# Patient Record
Sex: Female | Born: 1935 | Race: Black or African American | Hispanic: No | Marital: Single | Smoking: Never smoker
Health system: Southern US, Community
[De-identification: ages and names within clinical notes are randomized; demographics above are authoritative.]

## PROBLEM LIST (undated history)

## (undated) DIAGNOSIS — I10 Essential (primary) hypertension: Secondary | ICD-10-CM

## (undated) DIAGNOSIS — E785 Hyperlipidemia, unspecified: Secondary | ICD-10-CM

## (undated) DIAGNOSIS — E119 Type 2 diabetes mellitus without complications: Secondary | ICD-10-CM

---

## 2002-11-19 ENCOUNTER — Encounter: Payer: Self-pay | Admitting: Emergency Medicine

## 2002-11-19 ENCOUNTER — Emergency Department (HOSPITAL_COMMUNITY): Admission: EM | Admit: 2002-11-19 | Discharge: 2002-11-19 | Payer: Self-pay | Admitting: Emergency Medicine

## 2010-07-08 ENCOUNTER — Emergency Department: Payer: Self-pay | Admitting: Internal Medicine

## 2012-12-31 ENCOUNTER — Ambulatory Visit: Payer: Self-pay | Admitting: Internal Medicine

## 2013-01-12 ENCOUNTER — Ambulatory Visit: Payer: Self-pay | Admitting: Ophthalmology

## 2013-01-28 ENCOUNTER — Ambulatory Visit: Payer: Self-pay | Admitting: Internal Medicine

## 2013-01-28 HISTORY — PX: BREAST BIOPSY: SHX20

## 2013-03-10 ENCOUNTER — Ambulatory Visit: Payer: Self-pay | Admitting: Ophthalmology

## 2013-03-10 LAB — POTASSIUM: Potassium: 3.6 mmol/L (ref 3.5–5.1)

## 2013-03-23 ENCOUNTER — Ambulatory Visit: Payer: Self-pay | Admitting: Ophthalmology

## 2013-06-23 ENCOUNTER — Ambulatory Visit: Payer: Self-pay | Admitting: Internal Medicine

## 2013-06-25 ENCOUNTER — Ambulatory Visit: Payer: Self-pay | Admitting: Internal Medicine

## 2013-07-05 ENCOUNTER — Ambulatory Visit: Payer: Self-pay | Admitting: Internal Medicine

## 2013-07-06 LAB — PATHOLOGY REPORT

## 2014-01-10 ENCOUNTER — Ambulatory Visit: Payer: Self-pay | Admitting: Podiatry

## 2014-01-13 ENCOUNTER — Encounter: Payer: Self-pay | Admitting: Podiatry

## 2014-01-13 ENCOUNTER — Ambulatory Visit (INDEPENDENT_AMBULATORY_CARE_PROVIDER_SITE_OTHER): Payer: Medicare HMO | Admitting: Podiatry

## 2014-01-13 ENCOUNTER — Ambulatory Visit (INDEPENDENT_AMBULATORY_CARE_PROVIDER_SITE_OTHER): Payer: Medicare HMO

## 2014-01-13 VITALS — BP 159/82 | HR 100 | Resp 16 | Ht 66.0 in | Wt 130.0 lb

## 2014-01-13 DIAGNOSIS — M779 Enthesopathy, unspecified: Secondary | ICD-10-CM

## 2014-01-13 DIAGNOSIS — M79673 Pain in unspecified foot: Secondary | ICD-10-CM

## 2014-01-13 DIAGNOSIS — B351 Tinea unguium: Secondary | ICD-10-CM

## 2014-01-13 NOTE — Progress Notes (Signed)
   Subjective:    Patient ID: Alexis Oconnell, female    DOB: 1935-05-06, 78 y.o.   MRN: [id number]  HPI Comments: 85 her old female presents the office they with her son for diabetic risk assessment and for painful elongated toenails. The patient states that her nails are painful particularly with shoe gear. Over the last several months the patient does that she is started to have some pain in the bottom of her foot within the arch of her foot. She states that she hasn't pain with ambulation or after periods of activity. She's been soaking her feet Epson salts and her daughter has been trimming her toenails. She denies any recent injury or trauma to the area. Denies any recent redness, swelling. She denies any history of ulceration or any intermittent claudication symptoms. No other complaints at this time.      Review of Systems  All other systems reviewed and are negative.      Objective:   Physical Exam AAO x3, NAD DP/PT pulses palpable bilaterally, CRT less than 3 seconds Protective sensation intact with Simms Weinstein monofilament, vibratory sensation intact, Achilles tendon reflex intact Nails hypertrophic, dystrophic, elongated, brittle, discolored 10. No surrounding erythema or drainage from around the nail sites. No open lesions or pre-ulcerative lesions. There is decrease in medial arch height upon weightbearing. There is mild discomfort on the course of the peroneal tendon on the left foot. There is no evidence of pinpoint bony tenderness or pain with vibratory sensation to bilateral lower extremity. There is no overlying edema, erythema, increased warmth of bilateral lower extremities. MMT 4/5, ROM WNL No pain with calf compression, swelling, warmth, erythema. Upon evaluation of the patient's shoes they're significantly worn out.     Assessment & Plan:  78 year old female with symptomatically onychomycosis, left foot peroneal tendinitis -X-rays were obtained and reviewed with  the patient. -Treatment options were discussed with the patient including alternatives, risks, complications. -Nail sharply debrided 10 without complications. -Discussed importance daily foot inspection. -Discussed likely etiology of left foot peroneal tendinitis. -Recommended the patient to purchase new shoes. Also prescribed diabetic shoes/insert spur patient to go to [facility]. -Follow-up in 3 months or sooner if any problems are to arise. In the meantime, call the office with any questions, concerns, changes symptoms. If the area on the left foot worsens, or has not resolved after purchasing new shoes to call the office for further evaluation.

## 2014-01-13 NOTE — Patient Instructions (Signed)
Diabetes and Foot Care Diabetes may cause you to have problems because of poor blood supply (circulation) to your feet and legs. This may cause the skin on your feet to become thinner, break easier, and heal more slowly. Your skin may become dry, and the skin may peel and crack. You may also have nerve damage in your legs and feet causing decreased feeling in them. You may not notice minor injuries to your feet that could lead to infections or more serious problems. Taking care of your feet is one of the most important things you can do for yourself.  HOME CARE INSTRUCTIONS  Wear shoes at all times, even in the house. Do not go barefoot. Bare feet are easily injured.  Check your feet daily for blisters, cuts, and redness. If you cannot see the bottom of your feet, use a mirror or ask someone for help.  Wash your feet with warm water (do not use hot water) and mild soap. Then pat your feet and the areas between your toes until they are completely dry. Do not soak your feet as this can dry your skin.  Apply a moisturizing lotion or petroleum jelly (that does not contain alcohol and is unscented) to the skin on your feet and to dry, brittle toenails. Do not apply lotion between your toes.  Trim your toenails straight across. Do not dig under them or around the cuticle. File the edges of your nails with an emery board or nail file.  Do not cut corns or calluses or try to remove them with medicine.  Wear clean socks or stockings every day. Make sure they are not too tight. Do not wear knee-high stockings since they may decrease blood flow to your legs.  Wear shoes that fit properly and have enough cushioning. To break in new shoes, wear them for just a few hours a day. This prevents you from injuring your feet. Always look in your shoes before you put them on to be sure there are no objects inside.  Do not cross your legs. This may decrease the blood flow to your feet.  If you find a minor scrape,  cut, or break in the skin on your feet, keep it and the skin around it clean and dry. These areas may be cleansed with mild soap and water. Do not cleanse the area with peroxide, alcohol, or iodine.  When you remove an adhesive bandage, be sure not to damage the skin around it.  If you have a wound, look at it several times a day to make sure it is healing.  Do not use heating pads or hot water bottles. They may burn your skin. If you have lost feeling in your feet or legs, you may not know it is happening until it is too late.  Make sure your health care provider performs a complete foot exam at least annually or more often if you have foot problems. Report any cuts, sores, or bruises to your health care provider immediately. SEEK MEDICAL CARE IF:   You have an injury that is not healing.  You have cuts or breaks in the skin.  You have an ingrown nail.  You notice redness on your legs or feet.  You feel burning or tingling in your legs or feet.  You have pain or cramps in your legs and feet.  Your legs or feet are numb.  Your feet always feel cold. SEEK IMMEDIATE MEDICAL CARE IF:   There is increasing redness,   swelling, or pain in or around a wound.  There is a red line that goes up your leg.  Pus is coming from a wound.  You develop a fever or as directed by your health care provider.  You notice a bad smell coming from an ulcer or wound. Document Released: 01/12/2000 Document Revised: 09/16/2012 Document Reviewed: 06/23/2012 Exit[employer] Patient Information 2015 [employer]. This information is not intended to replace advice given to you by your health care provider. Make sure you discuss any questions you have with your health care provider.  

## 2014-04-14 ENCOUNTER — Ambulatory Visit: Payer: Medicare HMO | Admitting: Podiatry

## 2014-05-20 NOTE — Op Note (Signed)
PATIENT NAME:  Alexis Oconnell, Alexis Oconnell MR#:  [id number] DATE OF BIRTH:  Dec 25, 1935  DATE OF PROCEDURE:  01/12/2013  PREOPERATIVE DIAGNOSIS: Visually significant cataract of the left eye.   POSTOPERATIVE DIAGNOSIS: Visually significant cataract of the left eye.   OPERATIVE PROCEDURE: Cataract extraction by phacoemulsification with implant of intraocular lens to left eye.   SURGEON: Birder Robson, MD.   ANESTHESIA:  1. Managed anesthesia care.  2. Topical tetracaine drops followed by 2% Xylocaine jelly applied in the preoperative holding area.   COMPLICATIONS: None.   TECHNIQUE: Stop and chop.  DESCRIPTION OF PROCEDURE: The patient was examined and consented in the preoperative holding area where the aforementioned topical anesthesia was applied to the left eye and then brought back to the Operating Room where the left eye was prepped and draped in the usual sterile ophthalmic fashion and a lid speculum was placed. A paracentesis was created with the side port blade and the anterior chamber was filled with viscoelastic. A near clear corneal incision was performed with the steel keratome. A continuous curvilinear capsulorrhexis was performed with a cystotome followed by the capsulorrhexis forceps. Hydrodissection and hydrodelineation were carried out with BSS on a blunt cannula. The lens was removed in a stop and chop technique and the remaining cortical material was removed with the irrigation-aspiration handpiece. The capsular bag was inflated with viscoelastic and the Tecnis ZCB00 23.5-diopter lens, serial number [id number], was placed in the capsular bag without complication. The remaining viscoelastic was removed from the eye with the irrigation-aspiration handpiece. The wounds were hydrated. The anterior chamber was flushed with Miostat and the eye was inflated to physiologic pressure. 0.1 mL of cefuroxime concentration 10 mg/mL was placed in the anterior chamber. The wounds were found to be water  tight. The eye was dressed with Vigamox. The patient was given protective glasses to wear throughout the day and a shield with which to sleep tonight. The patient was also given drops with which to begin a drop regimen today and will follow-up with me in one day.    ____________________________ Livingston Diones. Aundraya Dripps, MD wlp:jcm D: 01/12/2013 15:23:00 ET T: 01/12/2013 15:48:00 ET JOB#: [id number]  cc: Omayra Tulloch L. Tyjae Shvartsman, MD, <Dictator>    Livingston Diones Azrielle Springsteen MD ELECTRONICALLY SIGNED 01/13/2013 9:50

## 2014-05-21 NOTE — Op Note (Signed)
PATIENT NAME:  Alexis Oconnell, TAPP MR#:  [id number] DATE OF BIRTH:  [birth year]  DATE OF PROCEDURE:  03/23/2013  PREOPERATIVE DIAGNOSIS: Visually significant cataract of the right  eye.   POSTOPERATIVE DIAGNOSIS: Visually significant cataract of the right eye.   OPERATIVE PROCEDURE: Cataract extraction by phacoemulsification with implant of intraocular lens to right eye.   SURGEON: Birder Robson, MD.   ANESTHESIA:  1. Managed anesthesia care.  2. Topical tetracaine drops followed by 2% Xylocaine jelly applied in the preoperative holding area.   COMPLICATIONS: None.   TECHNIQUE:  Stop and chop.   DESCRIPTION OF PROCEDURE: The patient was examined and consented in the preoperative holding area where the aforementioned topical anesthesia was applied to the right eye and then brought back to the Operating Room where the right eye was prepped and draped in the usual sterile ophthalmic fashion and a lid speculum was placed. A paracentesis was created with the side port blade and the anterior chamber was filled with viscoelastic. A near clear corneal incision was performed with the steel keratome. A continuous curvilinear capsulorrhexis was performed with a cystotome followed by the capsulorrhexis forceps. Hydrodissection and hydrodelineation were carried out with BSS on a blunt cannula. The lens was removed in a stop and chop technique and the remaining cortical material was removed with the irrigation-aspiration handpiece. The capsular bag was inflated with viscoelastic and the Tecnis ZCB 23.0-diopter lens, serial number [id number] was placed in the capsular bag without complication. The remaining viscoelastic was removed from the eye with the irrigation-aspiration handpiece. The wounds were hydrated. The anterior chamber was flushed with Miostat and the eye was inflated to physiologic pressure. 0.1 mL of cefuroxime concentration 10 mg/mL was placed in the anterior chamber. The wounds were found to be  water tight. The eye was dressed with Vigamox. The patient was given protective glasses to wear throughout the day and a shield with which to sleep tonight. The patient was also given drops with which to begin a drop regimen today and will follow-up with me in one day.    ____________________________ Livingston Diones. Beena Catano, MD wlp:sb D: 03/23/2013 15:01:10 ET T: 03/23/2013 15:07:20 ET JOB#: [id number]  cc: Braileigh Landenberger L. Griffon Herberg, MD, <Dictator>  Livingston Diones Sherri Levenhagen MD ELECTRONICALLY SIGNED 03/26/2013 16:10

## 2014-05-31 ENCOUNTER — Ambulatory Visit (INDEPENDENT_AMBULATORY_CARE_PROVIDER_SITE_OTHER): Payer: Medicare HMO | Admitting: Podiatry

## 2014-05-31 DIAGNOSIS — M79673 Pain in unspecified foot: Secondary | ICD-10-CM | POA: Diagnosis not present

## 2014-05-31 DIAGNOSIS — B351 Tinea unguium: Secondary | ICD-10-CM

## 2014-05-31 NOTE — Progress Notes (Signed)
Patient ID: Alexis Oconnell, female   DOB: January 13, 1936, 79 y.o.   MRN: [id number]  Subjective: 79 y.o.-year-old female returns the office today for painful, elongated, thickened toenails which she is unable to trim herself. Denies any redness or drainage around the nails. Denies any acute changes since last appointment and no new complaints today. Denies any systemic complaints such as fevers, chills, nausea, vomiting.   Objective: AAO 3, NAD DP/PT pulses palpable, CRT less than 3 seconds Protective sensation intact with Simms Weinstein monofilament, Achilles tendon reflex intact.  Nails hypertrophic, dystrophic, elongated, brittle, discolored 10. There is tenderness overlying the nails 1-5 bilaterally. There is no surrounding erythema or drainage along the nail sites. No open lesions or pre-ulcerative lesions are identified. No other areas of tenderness bilateral lower extremities. No overlying edema, erythema, increased warmth. No pain with calf compression, swelling, warmth, erythema.  Assessment: Patient presents with symptomatic onychomycosis  Plan: -Treatment options including alternatives, risks, complications were discussed -Nails sharply debrided 10 without complication/bleeding. -Discussed daily foot inspection. If there are any changes, to call the office immediately.  -Follow-up in 3 months or sooner if any problems are to arise. In the meantime, encouraged to call the office with any questions, concerns, changes symptoms.

## 2014-08-11 ENCOUNTER — Other Ambulatory Visit: Payer: Self-pay | Admitting: Internal Medicine

## 2014-08-11 DIAGNOSIS — Z1231 Encounter for screening mammogram for malignant neoplasm of breast: Secondary | ICD-10-CM

## 2014-08-17 ENCOUNTER — Other Ambulatory Visit: Payer: Self-pay | Admitting: Internal Medicine

## 2014-08-17 ENCOUNTER — Ambulatory Visit
Admission: RE | Admit: 2014-08-17 | Discharge: 2014-08-17 | Disposition: A | Discharge status: Home / Self Care | Payer: Medicare HMO | Source: Ambulatory Visit | Attending: Internal Medicine | Admitting: Internal Medicine

## 2014-08-17 DIAGNOSIS — Z1231 Encounter for screening mammogram for malignant neoplasm of breast: Secondary | ICD-10-CM | POA: Insufficient documentation

## 2014-08-17 DIAGNOSIS — R928 Other abnormal and inconclusive findings on diagnostic imaging of breast: Secondary | ICD-10-CM | POA: Insufficient documentation

## 2014-08-18 ENCOUNTER — Other Ambulatory Visit: Payer: Self-pay | Admitting: Internal Medicine

## 2014-08-18 DIAGNOSIS — N6320 Unspecified lump in the left breast, unspecified quadrant: Secondary | ICD-10-CM

## 2014-08-18 DIAGNOSIS — R928 Other abnormal and inconclusive findings on diagnostic imaging of breast: Secondary | ICD-10-CM

## 2014-08-23 ENCOUNTER — Ambulatory Visit
Admission: RE | Admit: 2014-08-23 | Discharge: 2014-08-23 | Disposition: A | Discharge status: Home / Self Care | Payer: Medicare HMO | Source: Ambulatory Visit | Attending: Internal Medicine | Admitting: Internal Medicine

## 2014-08-23 DIAGNOSIS — R928 Other abnormal and inconclusive findings on diagnostic imaging of breast: Secondary | ICD-10-CM

## 2014-08-23 DIAGNOSIS — N63 Unspecified lump in breast: Secondary | ICD-10-CM | POA: Insufficient documentation

## 2014-08-23 DIAGNOSIS — N6320 Unspecified lump in the left breast, unspecified quadrant: Secondary | ICD-10-CM

## 2014-08-30 ENCOUNTER — Ambulatory Visit (INDEPENDENT_AMBULATORY_CARE_PROVIDER_SITE_OTHER): Payer: Medicare HMO | Admitting: Podiatry

## 2014-08-30 DIAGNOSIS — M79673 Pain in unspecified foot: Secondary | ICD-10-CM

## 2014-08-30 DIAGNOSIS — B351 Tinea unguium: Secondary | ICD-10-CM | POA: Diagnosis not present

## 2014-08-30 NOTE — Progress Notes (Signed)
Patient ID: EVELLA KASAL, female   DOB: November 12, 1935, 79 y.o.   MRN: [id number]  Subjective: 79 y.o.-year-old female returns the office today for painful, elongated, thickened toenails which she is unable to trim herself. Denies any redness or drainage around the nails. Denies any acute changes since last appointment and no new complaints today. Denies any systemic complaints such as fevers, chills, nausea, vomiting.   Objective: AAO 3, NAD DP/PT pulses palpable 14 bilaterally, CRT less than 3 seconds Nails hypertrophic, dystrophic, elongated, brittle, discolored 10. There is tenderness overlying the nails 1-5 bilaterally. There is no surrounding erythema or drainage along the nail sites. No open lesions or pre-ulcerative lesions are identified. No other areas of tenderness bilateral lower extremities. No overlying edema, erythema, increased warmth. No pain with calf compression, swelling, warmth, erythema.  Assessment: Patient presents with symptomatic onychomycosis  Plan: -Treatment options including alternatives, risks, complications were discussed -Nails sharply debrided 10 without complication/bleeding. -Discussed daily foot inspection. If there are any changes, to call the office immediately.  -Follow-up in 3 months or sooner if any problems are to arise. In the meantime, encouraged to call the office with any questions, concerns, changes symptoms.   Celesta Gentile, DPM

## 2014-09-01 ENCOUNTER — Ambulatory Visit: Payer: Medicare HMO | Admitting: Podiatry

## 2014-09-06 ENCOUNTER — Ambulatory Visit: Payer: Medicare HMO | Admitting: Podiatry

## 2014-11-29 ENCOUNTER — Ambulatory Visit (INDEPENDENT_AMBULATORY_CARE_PROVIDER_SITE_OTHER): Payer: Medicare HMO | Admitting: Sports Medicine

## 2014-11-29 ENCOUNTER — Ambulatory Visit: Payer: Medicare HMO | Admitting: Sports Medicine

## 2014-11-29 ENCOUNTER — Ambulatory Visit: Payer: Medicare HMO

## 2014-11-29 ENCOUNTER — Encounter: Payer: Self-pay | Admitting: Sports Medicine

## 2014-11-29 DIAGNOSIS — B351 Tinea unguium: Secondary | ICD-10-CM

## 2014-11-29 DIAGNOSIS — E119 Type 2 diabetes mellitus without complications: Secondary | ICD-10-CM

## 2014-11-29 DIAGNOSIS — M79673 Pain in unspecified foot: Secondary | ICD-10-CM

## 2014-11-29 NOTE — Progress Notes (Signed)
Patient ID: Alexis Oconnell, female   DOB: July 28, 1935, 79 y.o.   MRN: [id number] Subjective: Alexis Oconnell is a 79 y.o. female patient with history of type 2 diabetes who presents to office today complaining of long, painful nails  while ambulating in shoes; unable to trim. Patient states that the glucose reading this morning was 120 mg/dl. Denies ETOH or Tobacco use. Patient denies any new changes in medication or new problems. Patient denies any new cramping, numbness, burning or tingling in the legs.  There are no active problems to display for this patient.  Current Outpatient Prescriptions on File Prior to Visit  Medication Sig Dispense Refill  . gabapentin (NEURONTIN) 100 MG capsule 2 (two) times daily.  3  . lisinopril-hydrochlorothiazide (PRINZIDE,ZESTORETIC) 20-25 MG per tablet daily.  3  . metFORMIN (GLUCOPHAGE) 500 MG tablet 2 (two) times daily.  3  . simvastatin (ZOCOR) 20 MG tablet daily.  3   No current facility-administered medications on file prior to visit.   No Known Allergies  Labs:HEMOGLOBIN A1C- No recent lab on file  Objective: General: Patient is awake, alert, and oriented x 3 and in no acute distress.  Integument: Skin is warm, dry and supple bilateral. Nails are tender, long, thickened and  dystrophic with subungual debris, consistent with onychomycosis, 1-5 bilateral. No signs of infection. No open lesions or preulcerative lesions present bilateral. Remaining integument unremarkable.  Vasculature:  Dorsalis Pedis pulse 1/4 bilateral. Posterior Tibial pulse 1/4 bilateral.  Capillary fill time <3 sec 1-5 bilateral. Scant hair growth to the level of the digits. Temperature gradient within normal limits. No varicosities present bilateral. No edema present bilateral.   Neurology: The patient has intact sensation measured with a 5.07/10g Semmes Weinstein Monofilament at all pedal sites bilateral . Vibratory sensation diminished bilateral with tuning fork. No Babinski  sign present bilateral.   Musculoskeletal: Asymptomatic hammertoe pedal deformities noted bilateral. Muscular strength 5/5 in all lower extremity muscular groups bilateral without pain or limitation on range of motion . No tenderness with calf compression bilateral.  Assessment and Plan: Problem List Items Addressed This Visit    None    Visit Diagnoses    Dermatophytosis of nail    -  Primary    Foot pain, unspecified laterality        Type 2 diabetes mellitus without complication, without long-term current use of insulin ([place])          -Examined patient. -Discussed and educated patient on diabetic foot care, especially with  regards to the vascular, neurological and musculoskeletal systems.  -Stressed the importance of good glycemic control and the detriment of not  controlling glucose levels in relation to the foot. -Mechanically debrided all nails 1-5 bilateral using sterile nail nipper and filed with dremel without incident  -Answered all patient questions -Patient to return in 3 months for at risk foot care -Patient advised to call the office if any problems or questions arise in the  Meantime.  Landis Martins, DPM

## 2014-12-19 DIAGNOSIS — N950 Postmenopausal bleeding: Secondary | ICD-10-CM | POA: Insufficient documentation

## 2014-12-19 DIAGNOSIS — G309 Alzheimer's disease, unspecified: Secondary | ICD-10-CM

## 2014-12-19 DIAGNOSIS — F0280 Dementia in other diseases classified elsewhere without behavioral disturbance: Secondary | ICD-10-CM | POA: Insufficient documentation

## 2014-12-19 DIAGNOSIS — I10 Essential (primary) hypertension: Secondary | ICD-10-CM | POA: Insufficient documentation

## 2014-12-19 DIAGNOSIS — E1140 Type 2 diabetes mellitus with diabetic neuropathy, unspecified: Secondary | ICD-10-CM | POA: Insufficient documentation

## 2014-12-19 DIAGNOSIS — E119 Type 2 diabetes mellitus without complications: Secondary | ICD-10-CM | POA: Insufficient documentation

## 2014-12-19 DIAGNOSIS — E1142 Type 2 diabetes mellitus with diabetic polyneuropathy: Secondary | ICD-10-CM | POA: Insufficient documentation

## 2015-01-26 DIAGNOSIS — C539 Malignant neoplasm of cervix uteri, unspecified: Secondary | ICD-10-CM | POA: Insufficient documentation

## 2015-01-26 DIAGNOSIS — Z8541 Personal history of malignant neoplasm of cervix uteri: Secondary | ICD-10-CM | POA: Insufficient documentation

## 2015-01-27 DIAGNOSIS — Z5111 Encounter for antineoplastic chemotherapy: Secondary | ICD-10-CM | POA: Insufficient documentation

## 2015-03-03 ENCOUNTER — Encounter: Payer: Self-pay | Admitting: Sports Medicine

## 2015-03-03 ENCOUNTER — Ambulatory Visit (INDEPENDENT_AMBULATORY_CARE_PROVIDER_SITE_OTHER): Payer: Medicare HMO | Admitting: Sports Medicine

## 2015-03-03 DIAGNOSIS — M79673 Pain in unspecified foot: Secondary | ICD-10-CM | POA: Diagnosis not present

## 2015-03-03 DIAGNOSIS — B351 Tinea unguium: Secondary | ICD-10-CM

## 2015-03-03 DIAGNOSIS — E119 Type 2 diabetes mellitus without complications: Secondary | ICD-10-CM

## 2015-03-03 NOTE — Progress Notes (Signed)
Patient ID: Alexis Oconnell, female   DOB: Jun 22, 1935, 80 y.o.   MRN: [id number]  Subjective: Alexis Oconnell is a 80 y.o. female patient with history of type 2 diabetes who presents to office today complaining of long, painful nails  while ambulating in shoes; unable to trim. Patient states that the glucose reading this morning was 119 mg/dl. Patient denies any new changes in medication or new problems. Patient denies any new cramping, numbness, burning or tingling in the legs.  There are no active problems to display for this patient.  Current Outpatient Prescriptions on File Prior to Visit  Medication Sig Dispense Refill  . gabapentin (NEURONTIN) 100 MG capsule 2 (two) times daily.  3  . lisinopril-hydrochlorothiazide (PRINZIDE,ZESTORETIC) 20-25 MG per tablet daily.  3  . metFORMIN (GLUCOPHAGE) 500 MG tablet 2 (two) times daily.  3  . simvastatin (ZOCOR) 20 MG tablet daily.  3   No current facility-administered medications on file prior to visit.   No Known Allergies   Objective: General: Patient is awake, alert, and oriented x 3 and in no acute distress.  Integument: Skin is warm, dry and supple bilateral. Nails are tender, long, thickened and  dystrophic with subungual debris, consistent with onychomycosis, 1-5 bilateral. No signs of infection. No open lesions or preulcerative lesions present bilateral. Remaining integument unremarkable.  Vasculature:  Dorsalis Pedis pulse 1/4 bilateral. Posterior Tibial pulse 1/4 bilateral.  Capillary fill time <3 sec 1-5 bilateral. Scant hair growth to the level of the digits. Temperature gradient within normal limits. No varicosities present bilateral. No edema present bilateral.   Neurology: The patient has intact sensation measured with a 5.07/10g Semmes Weinstein Monofilament at all pedal sites bilateral . Vibratory sensation diminished bilateral with tuning fork. No Babinski sign present bilateral.   Musculoskeletal: Asymptomatic hammertoe pedal  deformities noted bilateral. Muscular strength 5/5 in all lower extremity muscular groups bilateral without pain or limitation on range of motion . No tenderness with calf compression bilateral.  Assessment and Plan: Problem List Items Addressed This Visit    None    Visit Diagnoses    Dermatophytosis of nail    -  Primary    Foot pain, unspecified laterality        Type 2 diabetes mellitus without complication, without long-term current use of insulin ([facility])        Relevant Medications    aspirin EC 81 MG tablet    lisinopril-hydrochlorothiazide (PRINZIDE,ZESTORETIC) 20-25 MG tablet    metFORMIN (GLUCOPHAGE) 500 MG tablet    simvastatin (ZOCOR) 20 MG tablet      -Examined patient. -Discussed and educated patient on diabetic foot care, especially with  regards to the vascular, neurological and musculoskeletal systems.  -Stressed the importance of good glycemic control and the detriment of not  controlling glucose levels in relation to the foot. -Mechanically debrided all nails 1-5 bilateral using sterile nail nipper and filed with dremel without incident  -Answered all patient questions -Patient to return in 3 months for at risk foot care -Patient advised to call the office if any problems or questions arise in the meantime.  Landis Martins, DPM

## 2015-06-02 ENCOUNTER — Ambulatory Visit (INDEPENDENT_AMBULATORY_CARE_PROVIDER_SITE_OTHER): Payer: Medicare HMO | Admitting: Sports Medicine

## 2015-06-02 ENCOUNTER — Encounter: Payer: Self-pay | Admitting: Sports Medicine

## 2015-06-02 DIAGNOSIS — B351 Tinea unguium: Secondary | ICD-10-CM | POA: Diagnosis not present

## 2015-06-02 DIAGNOSIS — M79673 Pain in unspecified foot: Secondary | ICD-10-CM | POA: Diagnosis not present

## 2015-06-02 DIAGNOSIS — E119 Type 2 diabetes mellitus without complications: Secondary | ICD-10-CM | POA: Diagnosis not present

## 2015-06-02 NOTE — Progress Notes (Signed)
Patient ID: ATHENA FRICKE, female   DOB: [birth year], 80 y.o.   MRN: [id number]  Subjective: Alexis Oconnell is a 80 y.o. female patient with history of type 2 diabetes who presents to office today complaining of long, painful nails  while ambulating in shoes; unable to trim. Patient states that the glucose reading this morning was 104 mg/dl. Patient denies any new changes in medication or new problems. Patient denies any new cramping, numbness, burning or tingling in the legs.  Patient Active Problem List   Diagnosis Date Noted  . Encounter for antineoplastic chemotherapy 01/27/2015  . Malignant neoplasm of cervix ([place]) 01/26/2015  . AD (Alzheimer's disease) 12/19/2014  . Diabetes mellitus ([place]) 12/19/2014  . Diabetic neuropathy ([place]) 12/19/2014  . BP (high blood pressure) 12/19/2014  . Hemorrhage, postmenopausal 12/19/2014   Current Outpatient Prescriptions on File Prior to Visit  Medication Sig Dispense Refill  . aspirin EC 81 MG tablet Take 81 mg by mouth.    . dexamethasone (DECADRON) 4 MG tablet Take 8 mg by mouth.    . donepezil (ARICEPT) 5 MG tablet Take 5 mg by mouth.    . gabapentin (NEURONTIN) 100 MG capsule 2 (two) times daily.  3  . gabapentin (NEURONTIN) 100 MG capsule 2 (two) times daily.    Marland Kitchen lisinopril-hydrochlorothiazide (PRINZIDE,ZESTORETIC) 20-25 MG per tablet daily.  3  . lisinopril-hydrochlorothiazide (PRINZIDE,ZESTORETIC) 20-25 MG tablet daily.    . metFORMIN (GLUCOPHAGE) 500 MG tablet 2 (two) times daily.  3  . metFORMIN (GLUCOPHAGE) 500 MG tablet 2 (two) times daily.    . prochlorperazine (COMPAZINE) 10 MG tablet Take 10 mg by mouth.    . simvastatin (ZOCOR) 20 MG tablet daily.  3  . simvastatin (ZOCOR) 20 MG tablet daily.    . traMADol (ULTRAM) 50 MG tablet Take 50 mg by mouth.     No current facility-administered medications on file prior to visit.   No Known Allergies   Objective: General: Patient is awake, alert, and oriented x 3 and in no acute  distress.  Integument: Skin is warm, dry and supple bilateral. Nails are tender, long, thickened and  dystrophic with subungual debris, consistent with onychomycosis, 1-5 bilateral. No signs of infection. No open lesions or preulcerative lesions present bilateral. Remaining integument unremarkable.  Vasculature:  Dorsalis Pedis pulse 1/4 bilateral. Posterior Tibial pulse 1/4 bilateral.  Capillary fill time <3 sec 1-5 bilateral. Scant hair growth to the level of the digits. Temperature gradient within normal limits. No varicosities present bilateral. No edema present bilateral.   Neurology: The patient has intact sensation measured with a 5.07/10g Semmes Weinstein Monofilament at all pedal sites bilateral . Vibratory sensation diminished bilateral with tuning fork. No Babinski sign present bilateral.   Musculoskeletal: Asymptomatic hammertoe pedal deformities noted bilateral. Muscular strength 5/5 in all lower extremity muscular groups bilateral without pain or limitation on range of motion . No tenderness with calf compression bilateral.  Assessment and Plan: Problem List Items Addressed This Visit      Endocrine   Diabetes mellitus ([place])    Other Visit Diagnoses    Dermatophytosis of nail    -  Primary    Foot pain, unspecified laterality          -Examined patient. -Discussed and educated patient on diabetic foot care, especially with  regards to the vascular, neurological and musculoskeletal systems.  -Stressed the importance of good glycemic control and the detriment of not  controlling glucose levels in relation to the foot. -Mechanically  debrided all nails 1-5 bilateral using sterile nail nipper and filed with dremel without incident  -Answered all patient questions -Patient to return in 3 months for at risk foot care -Patient advised to call the office if any problems or questions arise in the meantime.  Landis Martins, DPM

## 2015-09-05 ENCOUNTER — Ambulatory Visit (INDEPENDENT_AMBULATORY_CARE_PROVIDER_SITE_OTHER): Payer: Medicare HMO | Admitting: Sports Medicine

## 2015-09-05 ENCOUNTER — Encounter: Payer: Self-pay | Admitting: Sports Medicine

## 2015-09-05 DIAGNOSIS — B351 Tinea unguium: Secondary | ICD-10-CM | POA: Diagnosis not present

## 2015-09-05 DIAGNOSIS — E119 Type 2 diabetes mellitus without complications: Secondary | ICD-10-CM

## 2015-09-05 DIAGNOSIS — M79673 Pain in unspecified foot: Secondary | ICD-10-CM

## 2015-09-05 NOTE — Progress Notes (Signed)
Patient ID: Alexis Oconnell, female   DOB: Aug 02, 1935, 80 y.o.   MRN: [id number]  Subjective: Alexis Oconnell is a 80 y.o. female patient with history of type 2 diabetes who presents to office today complaining of long, painful nails  while ambulating in shoes; unable to trim. Patient states that the glucose reading this morning was 114 mg/dl. Patient denies any new changes in medication or new problems. Patient denies any new cramping, numbness, burning or tingling in the legs.  Patient Active Problem List   Diagnosis Date Noted  . Encounter for antineoplastic chemotherapy 01/27/2015  . Malignant neoplasm of cervix ([place]) 01/26/2015  . AD (Alzheimer's disease) 12/19/2014  . Diabetes mellitus ([place]) 12/19/2014  . Diabetic neuropathy ([place]) 12/19/2014  . BP (high blood pressure) 12/19/2014  . Hemorrhage, postmenopausal 12/19/2014   Current Outpatient Prescriptions on File Prior to Visit  Medication Sig Dispense Refill  . aspirin EC 81 MG tablet Take 81 mg by mouth.    . dexamethasone (DECADRON) 4 MG tablet Take 8 mg by mouth.    . donepezil (ARICEPT) 5 MG tablet Take 5 mg by mouth.    . gabapentin (NEURONTIN) 100 MG capsule 2 (two) times daily.  3  . gabapentin (NEURONTIN) 100 MG capsule 2 (two) times daily.    Marland Kitchen lisinopril-hydrochlorothiazide (PRINZIDE,ZESTORETIC) 20-25 MG per tablet daily.  3  . lisinopril-hydrochlorothiazide (PRINZIDE,ZESTORETIC) 20-25 MG tablet daily.    . metFORMIN (GLUCOPHAGE) 500 MG tablet 2 (two) times daily.  3  . metFORMIN (GLUCOPHAGE) 500 MG tablet 2 (two) times daily.    . prochlorperazine (COMPAZINE) 10 MG tablet Take 10 mg by mouth.    . simvastatin (ZOCOR) 20 MG tablet daily.  3  . simvastatin (ZOCOR) 20 MG tablet daily.    . traMADol (ULTRAM) 50 MG tablet Take 50 mg by mouth.     No current facility-administered medications on file prior to visit.    No Known Allergies   Objective: General: Patient is awake, alert, and oriented x 3 and in no acute  distress.  Integument: Skin is warm, dry and supple bilateral. Nails are tender, long, thickened and dystrophic with subungual debris, consistent with onychomycosis, 1-5 bilateral. No signs of infection. No open lesions or preulcerative lesions present bilateral. Remaining integument unremarkable.  Vasculature:  Dorsalis Pedis pulse 1/4 bilateral. Posterior Tibial pulse 1/4 bilateral. Capillary fill time <3 sec 1-5 bilateral. Scant hair growth to the level of the digits.Temperature gradient within normal limits. No varicosities present bilateral. No edema present bilateral.   Neurology: The patient has intact sensation measured with a 5.07/10g Semmes Weinstein Monofilament at all pedal sites bilateral . Vibratory sensation diminished bilateral with tuning fork. No Babinski sign present bilateral.   Musculoskeletal: Asymptomatic hammertoe pedal deformities noted bilateral. Muscular strength 5/5 in all lower extremity muscular groups bilateral without pain or limitation on range of motion . No tenderness with calf compression bilateral.  Assessment and Plan: Problem List Items Addressed This Visit      Endocrine   Diabetes mellitus ([place])    Other Visit Diagnoses    Dermatophytosis of nail    -  Primary   Foot pain, unspecified laterality         -Examined patient. -Discussed and educated patient on diabetic foot care, especially with  regards to the vascular, neurological and musculoskeletal systems.  -Stressed the importance of good glycemic control and the detriment of not  controlling glucose levels in relation to the foot. -Mechanically debrided all nails 1-5  bilateral using sterile nail nipper and filed with dremel without incident  -Answered all patient questions -Patient to return in 3 months for at risk foot care -Patient advised to call the office if any problems or questions arise in the meantime.  Landis Martins, DPM

## 2015-10-11 ENCOUNTER — Other Ambulatory Visit: Payer: Self-pay | Admitting: Internal Medicine

## 2015-10-11 DIAGNOSIS — Z1231 Encounter for screening mammogram for malignant neoplasm of breast: Secondary | ICD-10-CM

## 2015-10-28 ENCOUNTER — Emergency Department
Admission: EM | Admit: 2015-10-28 | Discharge: 2015-10-28 | Disposition: A | Discharge status: Home / Self Care | Payer: Medicare HMO | Attending: Student | Admitting: Student

## 2015-10-28 ENCOUNTER — Emergency Department: Payer: Medicare HMO

## 2015-10-28 ENCOUNTER — Encounter: Payer: Self-pay | Admitting: Emergency Medicine

## 2015-10-28 DIAGNOSIS — E119 Type 2 diabetes mellitus without complications: Secondary | ICD-10-CM | POA: Diagnosis not present

## 2015-10-28 DIAGNOSIS — R109 Unspecified abdominal pain: Secondary | ICD-10-CM | POA: Insufficient documentation

## 2015-10-28 DIAGNOSIS — W1839XA Other fall on same level, initial encounter: Secondary | ICD-10-CM | POA: Insufficient documentation

## 2015-10-28 DIAGNOSIS — Y999 Unspecified external cause status: Secondary | ICD-10-CM | POA: Insufficient documentation

## 2015-10-28 DIAGNOSIS — Z7982 Long term (current) use of aspirin: Secondary | ICD-10-CM | POA: Insufficient documentation

## 2015-10-28 DIAGNOSIS — I10 Essential (primary) hypertension: Secondary | ICD-10-CM | POA: Diagnosis not present

## 2015-10-28 DIAGNOSIS — G309 Alzheimer's disease, unspecified: Secondary | ICD-10-CM | POA: Diagnosis not present

## 2015-10-28 DIAGNOSIS — Y92002 Bathroom of unspecified non-institutional (private) residence single-family (private) house as the place of occurrence of the external cause: Secondary | ICD-10-CM | POA: Insufficient documentation

## 2015-10-28 DIAGNOSIS — Z79899 Other long term (current) drug therapy: Secondary | ICD-10-CM | POA: Insufficient documentation

## 2015-10-28 DIAGNOSIS — Y939 Activity, unspecified: Secondary | ICD-10-CM | POA: Diagnosis not present

## 2015-10-28 DIAGNOSIS — Z7984 Long term (current) use of oral hypoglycemic drugs: Secondary | ICD-10-CM | POA: Diagnosis not present

## 2015-10-28 DIAGNOSIS — R55 Syncope and collapse: Secondary | ICD-10-CM | POA: Diagnosis not present

## 2015-10-28 DIAGNOSIS — Z853 Personal history of malignant neoplasm of breast: Secondary | ICD-10-CM | POA: Diagnosis not present

## 2015-10-28 DIAGNOSIS — Z8541 Personal history of malignant neoplasm of cervix uteri: Secondary | ICD-10-CM | POA: Diagnosis not present

## 2015-10-28 HISTORY — DX: Essential (primary) hypertension: I10

## 2015-10-28 HISTORY — DX: Hyperlipidemia, unspecified: E78.5

## 2015-10-28 HISTORY — DX: Type 2 diabetes mellitus without complications: E11.9

## 2015-10-28 LAB — URINALYSIS COMPLETE WITH MICROSCOPIC (ARMC ONLY)
BILIRUBIN URINE: NEGATIVE
Bacteria, UA: NONE SEEN
Glucose, UA: NEGATIVE mg/dL
KETONES UR: NEGATIVE mg/dL
Nitrite: NEGATIVE
Protein, ur: NEGATIVE mg/dL
SQUAMOUS EPITHELIAL / LPF: NONE SEEN
Specific Gravity, Urine: 1.002 — ABNORMAL LOW (ref 1.005–1.030)
pH: 6 (ref 5.0–8.0)

## 2015-10-28 LAB — CBC WITH DIFFERENTIAL/PLATELET
Basophils Absolute: 0 10*3/uL (ref 0–0.1)
Basophils Relative: 0 %
EOS ABS: 0.1 10*3/uL (ref 0–0.7)
EOS PCT: 1 %
HCT: 34.1 % — ABNORMAL LOW (ref 35.0–47.0)
Hemoglobin: 11.6 g/dL — ABNORMAL LOW (ref 12.0–16.0)
LYMPHS ABS: 1.3 10*3/uL (ref 1.0–3.6)
Lymphocytes Relative: 16 %
MCH: 30.8 pg (ref 26.0–34.0)
MCHC: 34 g/dL (ref 32.0–36.0)
MCV: 90.6 fL (ref 80.0–100.0)
MONO ABS: 0.4 10*3/uL (ref 0.2–0.9)
MONOS PCT: 5 %
Neutro Abs: 6 10*3/uL (ref 1.4–6.5)
Neutrophils Relative %: 78 %
PLATELETS: 186 10*3/uL (ref 150–440)
RBC: 3.77 MIL/uL — AB (ref 3.80–5.20)
RDW: 15.2 % — ABNORMAL HIGH (ref 11.5–14.5)
WBC: 7.8 10*3/uL (ref 3.6–11.0)

## 2015-10-28 LAB — COMPREHENSIVE METABOLIC PANEL
ALT: 12 U/L — AB (ref 14–54)
ANION GAP: 7 (ref 5–15)
AST: 25 U/L (ref 15–41)
Albumin: 3.7 g/dL (ref 3.5–5.0)
Alkaline Phosphatase: 72 U/L (ref 38–126)
BUN: 21 mg/dL — ABNORMAL HIGH (ref 6–20)
CALCIUM: 8.9 mg/dL (ref 8.9–10.3)
CO2: 25 mmol/L (ref 22–32)
CREATININE: 0.78 mg/dL (ref 0.44–1.00)
Chloride: 109 mmol/L (ref 101–111)
GFR calc Af Amer: >60 mL/min (ref >60)
Glucose, Bld: 171 mg/dL — ABNORMAL HIGH (ref 65–99)
Potassium: 3.6 mmol/L (ref 3.5–5.1)
Sodium: 141 mmol/L (ref 135–145)
Total Bilirubin: 0.3 mg/dL (ref 0.3–1.2)
Total Protein: 6.9 g/dL (ref 6.5–8.1)

## 2015-10-28 LAB — TROPONIN I

## 2015-10-28 LAB — LIPASE, BLOOD: Lipase: 19 U/L (ref 11–51)

## 2015-10-28 MED ORDER — SODIUM CHLORIDE 0.9 % IV BOLUS (SEPSIS)
500.0000 mL | Freq: Once | INTRAVENOUS | Status: AC
  Administered 2015-10-28: 500 mL via INTRAVENOUS

## 2015-10-28 NOTE — ED Triage Notes (Addendum)
BIB EMS from home pt states she went to the bathroom because she felt abd cramping. Per family she was found on the bathroom floor. Family states she was unresponsive for a few minutes and drooling on herself. Pt alert and oriented at this time

## 2015-10-28 NOTE — ED Provider Notes (Addendum)
[facility] Emergency Department Provider Note   ____________________________________________   First MD Initiated Contact with Patient 10/28/15 [phone]     (approximate)  I have reviewed the triage vital signs and the nursing notes.   HISTORY  Chief Complaint Loss of Consciousness    HPI Alexis Oconnell is a 80 y.o. female with history of diabetes, dementia, hypertension, history of treated cervical cancer who presents for evaluation of a syncopal episode which occurred suddenly just prior to arrival, gradual onset, now resolved,  Worse with bowel movement. Patient reports that she awoke this morning with some abdominal cramping and sensation that she needed to defecate. She did that once, return to the bed and again felt the urge to defecate. She went to the bathroom, was having abdominal cramping on the toilet and defecated at which point she also began feeling warm and nauseated. She called her grandson to bring her "something cold to drink". Grandson brought her a cola, she drank 2 sips but she stood up to wash her hands and fainted, it is unclear whether not she hit her head but she denies headache. Family called EMS, on their arrival, she was awake and alert. Currently she reports she feels well. She denies any continued abdominal pain or cramping. She denies any recent cough, vomiting, she denies any chest pain or difficulty breathing. She has otherwise been in her usual state of health.   Past Medical History:  Diagnosis Date  . Breast cancer ([place])   . Diabetes mellitus without complication ([place])   . Hyperlipemia   . Hypertension     Patient Active Problem List   Diagnosis Date Noted  . Encounter for antineoplastic chemotherapy 01/27/2015  . Malignant neoplasm of cervix ([place]) 01/26/2015  . AD (Alzheimer's disease) 12/19/2014  . Diabetes mellitus ([place]) 12/19/2014  . Diabetic neuropathy ([place]) 12/19/2014  . BP (high blood pressure) 12/19/2014  .  Hemorrhage, postmenopausal 12/19/2014    Past Surgical History:  Procedure Laterality Date  . BREAST BIOPSY Left ?   CORE W/CLIP - NEG    Prior to Admission medications   Medication Sig Start Date End Date Taking? Authorizing Provider  aspirin EC 81 MG tablet Take 81 mg by mouth every other day.    Yes Historical Provider, MD  donepezil (ARICEPT) 5 MG tablet Take 5 mg by mouth every morning.    Yes Historical Provider, MD  Fe Fum-FA-B Cmp-C-Zn-Mg-Mn-Cu (HEMOCYTE PLUS) 106-1 MG CAPS Take 1 capsule by mouth daily. 10/09/15  Yes Historical Provider, MD  gabapentin (NEURONTIN) 100 MG capsule Take 100 mg by mouth 2 times daily.   Yes Historical Provider, MD  lisinopril-hydrochlorothiazide (PRINZIDE,ZESTORETIC) 20-25 MG tablet Take 1 tablet by mouth every morning. 10/11/13  Yes Historical Provider, MD  metFORMIN (GLUCOPHAGE) 500 MG tablet Take 500 mg by mouth 2 times daily. 10/11/13  Yes Historical Provider, MD  prochlorperazine (COMPAZINE) 10 MG tablet Take 10 mg by mouth every morning.  01/27/15  Yes Historical Provider, MD  simvastatin (ZOCOR) 20 MG tablet Take 20 mg by mouth every night at bedtime. 10/11/13  Yes Historical Provider, MD  traMADol (ULTRAM) 50 MG tablet Take 50 mg by mouth every 8 (eight) hours as needed for moderate pain or severe pain.  03/02/14  Yes Historical Provider, MD    Allergies Review of patient's allergies indicates no known allergies.  Family History  Problem Relation Age of Onset  . Breast cancer Neg Hx     Social History Social History  Substance Use Topics  . Smoking status: Never Smoker  . Smokeless tobacco: Never Used  . Alcohol use No    Review of Systems Constitutional: No fever/chills Eyes: No visual changes. ENT: No sore throat. Cardiovascular: Denies chest pain. Respiratory: Denies shortness of breath. Gastrointestinal: + abdominal cramping.  No nausea, no vomiting.  No diarrhea.  No constipation. Genitourinary: Negative for  dysuria. Musculoskeletal: Negative for back pain. Skin: Negative for rash. Neurological: Negative for headaches, focal weakness or numbness.  10-point ROS otherwise negative.  ____________________________________________   PHYSICAL EXAM:  Vitals:   10/28/15 0830 10/28/15 0837 10/28/15 1000 10/28/15 1130  BP:  (!) 156/67 (!) 151/77 138/81  Pulse:  85 (!) 112 90  Resp:  16 20 16   Temp:  98.6 F (37 C)    TempSrc:  Oral    SpO2: 100% 98% 100% 100%  Weight:  151 lb (68.5 kg)    Height:  5\' 5"  (1.651 m)      VITAL SIGNS: ED Triage Vitals  Enc Vitals Group     BP 10/28/15 0837 (!) 156/67     Pulse Rate 10/28/15 0837 85     Resp 10/28/15 0837 16     Temp 10/28/15 0837 98.6 F (37 C)     Temp Source 10/28/15 0837 Oral     SpO2 10/28/15 0830 100 %     Weight 10/28/15 0837 151 lb (68.5 kg)     Height 10/28/15 0837 5\' 5"  (1.651 m)     Head Circumference --      Peak Flow --      Pain Score --      Pain Loc --      Pain Edu? --      Excl. in [place]? --     Constitutional: Alert and oriented. Well appearing and in no acute distress. Eyes: Conjunctivae are normal. PERRL. EOMI. Head: Atraumatic. Nose: No congestion/rhinnorhea. Mouth/Throat: Mucous membranes are moist.  Oropharynx non-erythematous. Neck: No stridor.supple without meningismus. No midline C-spine tenderness to palpation. Cardiovascular: Normal rate, regular rhythm. Grossly normal heart sounds.  Good peripheral circulation. Respiratory: Normal respiratory effort.  No retractions. Lungs CTAB. Gastrointestinal: Soft and nontender. No distention. Normal bowel sounds. No CVA tenderness. Genitourinary: deferred Musculoskeletal: No lower extremity tenderness nor edema.  No joint effusions. Neurologic:  Normal speech and language. No gross focal neurologic deficits are appreciated. 5 out of 5 strength in bilateral upper and lower extremities, sensation intact to light touch throughout, cranial nerves II through XII intact,  normal finger-nose-finger without dysmetria. Skin:  Skin is warm, dry and intact. No rash noted. Psychiatric: Mood and affect are normal. Speech and behavior are normal.  ____________________________________________   LABS (all labs ordered are listed, but only abnormal results are displayed)  Labs Reviewed  CBC WITH DIFFERENTIAL/PLATELET - Abnormal; Notable for the following:       Result Value   RBC 3.77 (*)    Hemoglobin 11.6 (*)    HCT 34.1 (*)    RDW 15.2 (*)    All other components within normal limits  COMPREHENSIVE METABOLIC PANEL - Abnormal; Notable for the following:    Glucose, Bld 171 (*)    BUN 21 (*)    ALT 12 (*)    All other components within normal limits  URINALYSIS COMPLETEWITH MICROSCOPIC ([facility] ONLY) - Abnormal; Notable for the following:    Color, Urine COLORLESS (*)    APPearance CLEAR (*)    Specific Gravity, Urine 1.002 (*)  Hgb urine dipstick 1+ (*)    Leukocytes, UA TRACE (*)    All other components within normal limits  LIPASE, BLOOD  TROPONIN I   ____________________________________________  EKG  ED ECG REPORT I, Joanne Gavel, the attending physician, personally viewed and interpreted this ECG.   Date: 10/28/2015  EKG Time: 08:33  Rate: 91  Rhythm: normal sinus rhythm  Axis: normal  Intervals:none  ST&T Change: No acute ST elevation or acute ST depression. Borderline prolonged QT interval.  ____________________________________________  RADIOLOGY  CT head IMPRESSION:  1. No acute intracranial abnormalities.      CXR IMPRESSION:  No active cardiopulmonary disease.      ____________________________________________   PROCEDURES  Procedure(s) performed: None  Procedures  Critical Care performed: No  ____________________________________________   INITIAL IMPRESSION / ASSESSMENT AND PLAN / ED COURSE  Pertinent labs & imaging results that were available during my care of the patient were reviewed by me and  considered in my medical decision making (see chart for details).  Alexis Oconnell is a 80 y.o. female with history of diabetes, dementia, hypertension, history of treated cervical cancer who presents for evaluation of a syncopal episode which occurred suddenly just prior to arrival. On exam, she is very well-appearing and in no acute distress, her vital signs are stable, she is afebrile, she has benign physical exam an intact neurological examination. She has no complaints at this time. Suspect vasovagal syncope, likely triggered by a large bowel movement/vagal nerve activation. We'll obtain screening labs, CT head given syncope and fall with possible head injury,  Chest x-ray, urinalysis and check orthostatics. EKG reassuring, not consistent with acute ischemia, normal sinus rhythm.  ----------------------------------------- 11:46 AM on 10/28/2015 ----------------------------------------- Patient reports she feels well and wants to go home. CBC with mild anemia, hemoglobin 11.6, negative troponin, normal lipase, CMP generally unremarkable. CT head negative for any acute intracranial process and chest x-ray shows no acute cardiopulmonary abnormality. Doubt cardiogenic or neurogenic cause of syncope. Attempted to obtain urine sample however the patient missed the collection hat in the toilet and we were not able to collect it. She does not want to stay any longer to wait to reporduce repeat sample, has refused catheterization. I discussed with her that I would like to rule out a urinary tract infection but she does not want to stay and her family at bedside is comfortable taking her home and she will follow-up with her primary care doctor on Monday. She has been observed on the cardiac monitor for several hours without any documented clearly significant arrhythmia. We discussed meticulous return precautions need for close PCP follow-up and all are comfortable with the discharge plan. DC  home.  ----------------------------------------- 12:20 PM on 10/28/2015 -----------------------------------------  Patient was able to produce urine sample, and it is negative for UTI. DC home.  Clinical Course     ____________________________________________   FINAL CLINICAL IMPRESSION(S) / ED DIAGNOSES  Final diagnoses:  Syncope, unspecified syncope type      NEW MEDICATIONS STARTED DURING THIS VISIT:  New Prescriptions   No medications on file     Note:  This document was prepared using Dragon voice recognition software and may include unintentional dictation errors.    Joanne Gavel, MD 10/28/15 [place] Asuncion Shibata, MD 10/28/15 [phone]

## 2015-10-28 NOTE — ED Notes (Signed)
Multiple family members in room some asked to rotate out and keep the number of visitors to a maximum of 2. Pt crying HR elevated 130's. Edd Fabian, MD informed will try to do orthostatic blood pressures when pt calms down.

## 2015-10-28 NOTE — ED Notes (Signed)
Nurse attempts to get urine sample from pt again. States that she went to the bathroom with family but did not catch urine in the specimen container. Pt refuses in/out cath. Family at bedside.

## 2015-11-02 ENCOUNTER — Ambulatory Visit
Admission: RE | Admit: 2015-11-02 | Discharge: 2015-11-02 | Disposition: A | Discharge status: Home / Self Care | Payer: Medicare HMO | Source: Ambulatory Visit | Attending: Internal Medicine | Admitting: Internal Medicine

## 2015-11-02 DIAGNOSIS — Z1231 Encounter for screening mammogram for malignant neoplasm of breast: Secondary | ICD-10-CM | POA: Diagnosis present

## 2015-12-08 ENCOUNTER — Ambulatory Visit (INDEPENDENT_AMBULATORY_CARE_PROVIDER_SITE_OTHER): Payer: Medicare HMO | Admitting: Podiatry

## 2015-12-08 VITALS — BP 188/81 | Temp 87.0°F | Resp 16

## 2015-12-08 DIAGNOSIS — B351 Tinea unguium: Secondary | ICD-10-CM | POA: Diagnosis not present

## 2015-12-08 DIAGNOSIS — L603 Nail dystrophy: Secondary | ICD-10-CM

## 2015-12-08 DIAGNOSIS — L608 Other nail disorders: Secondary | ICD-10-CM

## 2015-12-08 DIAGNOSIS — M79676 Pain in unspecified toe(s): Secondary | ICD-10-CM | POA: Diagnosis not present

## 2015-12-08 DIAGNOSIS — E0843 Diabetes mellitus due to underlying condition with diabetic autonomic (poly)neuropathy: Secondary | ICD-10-CM

## 2015-12-12 NOTE — Progress Notes (Signed)
SUBJECTIVE Patient with a history of diabetes mellitus presents to office today complaining of elongated, thickened nails. Pain while ambulating in shoes. Patient is unable to trim their own nails.   No Known Allergies  OBJECTIVE General Patient is awake, alert, and oriented x 3 and in no acute distress. Derm Skin is dry and supple bilateral. Negative open lesions or macerations. Remaining integument unremarkable. Nails are tender, long, thickened and dystrophic with subungual debris, consistent with onychomycosis, 1-5 bilateral. No signs of infection noted. Vasc  DP and PT pedal pulses palpable bilaterally. Temperature gradient within normal limits.  Neuro Epicritic and protective threshold sensation diminished bilaterally.  Musculoskeletal Exam No symptomatic pedal deformities noted bilateral. Muscular strength within normal limits.  ASSESSMENT 1. Diabetes Mellitus w/ peripheral neuropathy 2. Onychomycosis of nail due to dermatophyte bilateral 3. Pain in foot bilateral  PLAN OF CARE 1. Patient evaluated today. 2. Instructed to maintain good pedal hygiene and foot care. Stressed importance of controlling blood sugar.  3. Mechanical debridement of nails 1-5 bilaterally performed using a nail nipper. Filed with dremel without incident.  4. Return to clinic in 3 mos.    Brent M Evans, DPM   

## 2016-01-17 DIAGNOSIS — E279 Disorder of adrenal gland, unspecified: Secondary | ICD-10-CM | POA: Insufficient documentation

## 2016-03-14 ENCOUNTER — Ambulatory Visit: Payer: Medicare HMO | Admitting: Podiatry

## 2016-03-19 ENCOUNTER — Ambulatory Visit (INDEPENDENT_AMBULATORY_CARE_PROVIDER_SITE_OTHER): Payer: Medicare HMO | Admitting: Podiatry

## 2016-03-19 ENCOUNTER — Encounter: Payer: Self-pay | Admitting: Podiatry

## 2016-03-19 DIAGNOSIS — L603 Nail dystrophy: Secondary | ICD-10-CM

## 2016-03-19 DIAGNOSIS — E0843 Diabetes mellitus due to underlying condition with diabetic autonomic (poly)neuropathy: Secondary | ICD-10-CM | POA: Diagnosis not present

## 2016-03-19 DIAGNOSIS — L608 Other nail disorders: Secondary | ICD-10-CM

## 2016-03-19 DIAGNOSIS — M79609 Pain in unspecified limb: Secondary | ICD-10-CM

## 2016-03-19 DIAGNOSIS — B351 Tinea unguium: Secondary | ICD-10-CM

## 2016-03-19 NOTE — Progress Notes (Signed)
   SUBJECTIVE Patient with a history of diabetes mellitus presents to office today complaining of elongated, thickened nails. Pain while ambulating in shoes. Patient is unable to trim their own nails.   OBJECTIVE General Patient is awake, alert, and oriented x 3 and in no acute distress. Derm Skin is dry and supple bilateral. Negative open lesions or macerations. Remaining integument unremarkable. Nails are tender, long, thickened and dystrophic with subungual debris, consistent with onychomycosis, 1-5 bilateral. No signs of infection noted. Vasc  DP and PT pedal pulses palpable bilaterally. Temperature gradient within normal limits.  Neuro Epicritic and protective threshold sensation diminished bilaterally.  Musculoskeletal Exam No symptomatic pedal deformities noted bilateral. Muscular strength within normal limits.  ASSESSMENT 1. Diabetes Mellitus w/ peripheral neuropathy 2. Onychomycosis of nail due to dermatophyte bilateral 3. Pain in foot bilateral  PLAN OF CARE 1. Patient evaluated today. 2. Instructed to maintain good pedal hygiene and foot care. Stressed importance of controlling blood sugar.  3. Mechanical debridement of nails 1-5 bilaterally performed using a nail nipper. Filed with dremel without incident.  4. Return to clinic in 3 mos.     Meriel Kelliher M. Rontavious Albright, DPM [facility]  Dr. Mira Balon M. Fidencia Mccloud, DPM    [address]                                        [place], [place] [address]                Office [phone]  Fax [phone]       

## 2016-06-18 ENCOUNTER — Ambulatory Visit (INDEPENDENT_AMBULATORY_CARE_PROVIDER_SITE_OTHER): Payer: Medicare HMO | Admitting: Podiatry

## 2016-06-18 DIAGNOSIS — E0842 Diabetes mellitus due to underlying condition with diabetic polyneuropathy: Secondary | ICD-10-CM

## 2016-06-18 DIAGNOSIS — B351 Tinea unguium: Secondary | ICD-10-CM | POA: Diagnosis not present

## 2016-06-18 DIAGNOSIS — M79676 Pain in unspecified toe(s): Secondary | ICD-10-CM | POA: Diagnosis not present

## 2016-06-18 NOTE — Progress Notes (Signed)
   SUBJECTIVE Patient with a history of diabetes mellitus presents to office today complaining of elongated, thickened nails. Pain while ambulating in shoes. Patient is unable to trim their own nails.   OBJECTIVE General Patient is awake, alert, and oriented x 3 and in no acute distress. Derm Skin is dry and supple bilateral. Negative open lesions or macerations. Remaining integument unremarkable. Nails are tender, long, thickened and dystrophic with subungual debris, consistent with onychomycosis, 1-5 bilateral. No signs of infection noted. Vasc  DP and PT pedal pulses palpable bilaterally. Temperature gradient within normal limits.  Neuro Epicritic and protective threshold sensation diminished bilaterally.  Musculoskeletal Exam No symptomatic pedal deformities noted bilateral. Muscular strength within normal limits.  ASSESSMENT 1. Diabetes Mellitus w/ peripheral neuropathy 2. Onychomycosis of nail due to dermatophyte bilateral 3. Pain in foot bilateral  PLAN OF CARE 1. Patient evaluated today. 2. Instructed to maintain good pedal hygiene and foot care. Stressed importance of controlling blood sugar.  3. Mechanical debridement of nails 1-5 bilaterally performed using a nail nipper. Filed with dremel without incident.  4. Return to clinic in 3 mos.     Yatziri Wainwright M. Ardis Fullwood, DPM [facility]  Dr. Leonette Tischer M. Tennille Montelongo, DPM    [address]                                        [place], [place] [address]                Office [phone]  Fax [phone]       

## 2016-09-24 ENCOUNTER — Ambulatory Visit (INDEPENDENT_AMBULATORY_CARE_PROVIDER_SITE_OTHER): Payer: Medicare HMO | Admitting: Podiatry

## 2016-09-24 DIAGNOSIS — M79676 Pain in unspecified toe(s): Secondary | ICD-10-CM

## 2016-09-24 DIAGNOSIS — E0842 Diabetes mellitus due to underlying condition with diabetic polyneuropathy: Secondary | ICD-10-CM

## 2016-09-24 DIAGNOSIS — B351 Tinea unguium: Secondary | ICD-10-CM | POA: Diagnosis not present

## 2016-09-30 NOTE — Progress Notes (Signed)
   SUBJECTIVE Patient with a history of diabetes mellitus presents to office today complaining of elongated, thickened nails. Pain while ambulating in shoes. Patient is unable to trim their own nails.   OBJECTIVE General Patient is awake, alert, and oriented x 3 and in no acute distress. Derm Skin is dry and supple bilateral. Negative open lesions or macerations. Remaining integument unremarkable. Nails are tender, long, thickened and dystrophic with subungual debris, consistent with onychomycosis, 1-5 bilateral. No signs of infection noted. Vasc  DP and PT pedal pulses palpable bilaterally. Temperature gradient within normal limits.  Neuro Epicritic and protective threshold sensation diminished bilaterally.  Musculoskeletal Exam No symptomatic pedal deformities noted bilateral. Muscular strength within normal limits.  ASSESSMENT 1. Diabetes Mellitus w/ peripheral neuropathy 2. Onychomycosis of nail due to dermatophyte bilateral 3. Pain in foot bilateral  PLAN OF CARE 1. Patient evaluated today. 2. Instructed to maintain good pedal hygiene and foot care. Stressed importance of controlling blood sugar.  3. Mechanical debridement of nails 1-5 bilaterally performed using a nail nipper. Filed with dremel without incident.  4. Return to clinic in 3 mos.     Hayle Parisi M. Jerell Demery, DPM [facility]  Dr. Guilford Shannahan M. Frankie Scipio, DPM    [address]                                        [place], [place] [address]                Office [phone]  Fax [phone]       

## 2016-12-27 ENCOUNTER — Encounter: Payer: Self-pay | Admitting: Podiatry

## 2016-12-27 ENCOUNTER — Ambulatory Visit (INDEPENDENT_AMBULATORY_CARE_PROVIDER_SITE_OTHER): Payer: Medicare HMO | Admitting: Podiatry

## 2016-12-27 DIAGNOSIS — M79676 Pain in unspecified toe(s): Secondary | ICD-10-CM

## 2016-12-27 DIAGNOSIS — B351 Tinea unguium: Secondary | ICD-10-CM | POA: Diagnosis not present

## 2016-12-27 DIAGNOSIS — E0842 Diabetes mellitus due to underlying condition with diabetic polyneuropathy: Secondary | ICD-10-CM | POA: Diagnosis not present

## 2016-12-30 NOTE — Progress Notes (Signed)
   SUBJECTIVE Patient with a history of diabetes mellitus presents to office today complaining of elongated, thickened nails. Pain while ambulating in shoes. Patient is unable to trim their own nails.   Past Medical History:  Diagnosis Date  . Diabetes mellitus without complication ([place])   . Hyperlipemia   . Hypertension     OBJECTIVE General Patient is awake, alert, and oriented x 3 and in no acute distress. Derm Skin is dry and supple bilateral. Negative open lesions or macerations. Remaining integument unremarkable. Nails are tender, long, thickened and dystrophic with subungual debris, consistent with onychomycosis, 1-5 bilateral. No signs of infection noted. Vasc  DP and PT pedal pulses palpable bilaterally. Temperature gradient within normal limits.  Neuro Epicritic and protective threshold sensation diminished bilaterally.  Musculoskeletal Exam No symptomatic pedal deformities noted bilateral. Muscular strength within normal limits.  ASSESSMENT 1. Diabetes Mellitus w/ peripheral neuropathy 2. Onychomycosis of nail due to dermatophyte bilateral 3. Pain in foot bilateral  PLAN OF CARE 1. Patient evaluated today. 2. Instructed to maintain good pedal hygiene and foot care. Stressed importance of controlling blood sugar.  3. Mechanical debridement of nails 1-5 bilaterally performed using a nail nipper. Filed with dremel without incident.  4. Return to clinic in 3 mos.     Edrick Kins, DPM [facility]  Dr. Edrick Kins, [place]                                        [place], [place] [address]                Office [phone]  Fax [phone]

## 2017-02-25 DIAGNOSIS — R8281 Pyuria: Secondary | ICD-10-CM | POA: Insufficient documentation

## 2017-03-28 ENCOUNTER — Ambulatory Visit: Payer: Medicare HMO | Admitting: Podiatry

## 2017-03-28 ENCOUNTER — Encounter: Payer: Self-pay | Admitting: Podiatry

## 2017-03-28 DIAGNOSIS — E0842 Diabetes mellitus due to underlying condition with diabetic polyneuropathy: Secondary | ICD-10-CM

## 2017-03-28 DIAGNOSIS — B351 Tinea unguium: Secondary | ICD-10-CM | POA: Diagnosis not present

## 2017-03-28 DIAGNOSIS — M79676 Pain in unspecified toe(s): Secondary | ICD-10-CM

## 2017-03-31 NOTE — Progress Notes (Signed)
   SUBJECTIVE Patient with a history of diabetes mellitus presents to office today complaining of elongated, thickened nails. Pain while ambulating in shoes. Patient is unable to trim their own nails.   Past Medical History:  Diagnosis Date  . Diabetes mellitus without complication ([place])   . Hyperlipemia   . Hypertension     OBJECTIVE General Patient is awake, alert, and oriented x 3 and in no acute distress. Derm Skin is dry and supple bilateral. Negative open lesions or macerations. Remaining integument unremarkable. Nails are tender, long, thickened and dystrophic with subungual debris, consistent with onychomycosis, 1-5 bilateral. No signs of infection noted. Vasc  DP and PT pedal pulses palpable bilaterally. Temperature gradient within normal limits.  Neuro Epicritic and protective threshold sensation diminished bilaterally.  Musculoskeletal Exam No symptomatic pedal deformities noted bilateral. Muscular strength within normal limits.  ASSESSMENT 1. Diabetes Mellitus w/ peripheral neuropathy 2. Onychomycosis of nail due to dermatophyte bilateral 3. Pain in foot bilateral  PLAN OF CARE 1. Patient evaluated today. 2. Instructed to maintain good pedal hygiene and foot care. Stressed importance of controlling blood sugar.  3. Mechanical debridement of nails 1-5 bilaterally performed using a nail nipper. Filed with dremel without incident.  4. Return to clinic in 3 mos.     Edrick Kins, DPM [facility]  Dr. Edrick Kins, [place]                                        [place], [place] [address]                Office [phone]  Fax [phone]

## 2017-07-01 ENCOUNTER — Encounter: Payer: Self-pay | Admitting: Podiatry

## 2017-07-01 ENCOUNTER — Ambulatory Visit: Payer: Medicare HMO | Admitting: Podiatry

## 2017-07-01 DIAGNOSIS — M79676 Pain in unspecified toe(s): Secondary | ICD-10-CM

## 2017-07-01 DIAGNOSIS — B351 Tinea unguium: Secondary | ICD-10-CM | POA: Diagnosis not present

## 2017-07-01 DIAGNOSIS — E0842 Diabetes mellitus due to underlying condition with diabetic polyneuropathy: Secondary | ICD-10-CM

## 2017-07-03 NOTE — Progress Notes (Signed)
   SUBJECTIVE Patient with a history of diabetes mellitus presents to office today complaining of elongated, thickened nails that cause pain while ambulating in shoes. She is unable to trim her own nails. Patient is here for further evaluation and treatment.   Past Medical History:  Diagnosis Date  . Diabetes mellitus without complication ([facility])   . Hyperlipemia   . Hypertension     OBJECTIVE General Patient is awake, alert, and oriented x 3 and in no acute distress. Derm Skin is dry and supple bilateral. Negative open lesions or macerations. Remaining integument unremarkable. Nails are tender, long, thickened and dystrophic with subungual debris, consistent with onychomycosis, 1-5 bilateral. No signs of infection noted. Vasc  DP and PT pedal pulses palpable bilaterally. Temperature gradient within normal limits.  Neuro Epicritic and protective threshold sensation diminished bilaterally.  Musculoskeletal Exam No symptomatic pedal deformities noted bilateral. Muscular strength within normal limits.  ASSESSMENT 1. Diabetes Mellitus w/ peripheral neuropathy 2. Onychomycosis of nail due to dermatophyte bilateral 3. Pain in foot bilateral  PLAN OF CARE 1. Patient evaluated today. 2. Instructed to maintain good pedal hygiene and foot care. Stressed importance of controlling blood sugar.  3. Mechanical debridement of nails 1-5 bilaterally performed using a nail nipper. Filed with dremel without incident.  4. Return to clinic in 3 mos.     Kimarie Coor M. Shadrach Bartunek, DPM [facility]  Dr. Shirleen Mcfaul M. Wylder Macomber, DPM    [address]                                        [place], [place] [address]                Office [phone]  Fax [phone]      

## 2017-10-31 ENCOUNTER — Encounter: Payer: Self-pay | Admitting: Podiatry

## 2017-10-31 ENCOUNTER — Ambulatory Visit: Payer: Medicare HMO | Admitting: Podiatry

## 2017-10-31 DIAGNOSIS — B351 Tinea unguium: Secondary | ICD-10-CM

## 2017-10-31 DIAGNOSIS — M79676 Pain in unspecified toe(s): Secondary | ICD-10-CM | POA: Diagnosis not present

## 2017-10-31 DIAGNOSIS — E0842 Diabetes mellitus due to underlying condition with diabetic polyneuropathy: Secondary | ICD-10-CM | POA: Diagnosis not present

## 2017-11-02 NOTE — Progress Notes (Signed)
   SUBJECTIVE Patient with a history of diabetes mellitus presents to office today complaining of elongated, thickened nails that cause pain while ambulating in shoes. She is unable to trim her own nails. Patient is here for further evaluation and treatment.   Past Medical History:  Diagnosis Date  . Diabetes mellitus without complication ([facility])   . Hyperlipemia   . Hypertension     OBJECTIVE General Patient is awake, alert, and oriented x 3 and in no acute distress. Derm Skin is dry and supple bilateral. Negative open lesions or macerations. Remaining integument unremarkable. Nails are tender, long, thickened and dystrophic with subungual debris, consistent with onychomycosis, 1-5 bilateral. No signs of infection noted. Vasc  DP and PT pedal pulses palpable bilaterally. Temperature gradient within normal limits.  Neuro Epicritic and protective threshold sensation diminished bilaterally.  Musculoskeletal Exam No symptomatic pedal deformities noted bilateral. Muscular strength within normal limits.  ASSESSMENT 1. Diabetes Mellitus w/ peripheral neuropathy 2. Onychomycosis of nail due to dermatophyte bilateral 3. Pain in foot bilateral  PLAN OF CARE 1. Patient evaluated today. 2. Instructed to maintain good pedal hygiene and foot care. Stressed importance of controlling blood sugar.  3. Mechanical debridement of nails 1-5 bilaterally performed using a nail nipper. Filed with dremel without incident.  4. Return to clinic in 3 mos.     Nitesh Pitstick M. Jennell Janosik, DPM [facility]  Dr. Anjelina Dung M. Jasani Lengel, DPM    [address]                                        [place], [place] [address]                Office [phone]  Fax [phone]      

## 2018-01-30 ENCOUNTER — Ambulatory Visit: Payer: Medicare HMO | Admitting: Podiatry

## 2018-03-06 ENCOUNTER — Encounter: Payer: Self-pay | Admitting: Podiatry

## 2018-03-06 ENCOUNTER — Ambulatory Visit: Payer: Medicare Other | Admitting: Podiatry

## 2018-03-06 DIAGNOSIS — E0842 Diabetes mellitus due to underlying condition with diabetic polyneuropathy: Secondary | ICD-10-CM

## 2018-03-06 DIAGNOSIS — B351 Tinea unguium: Secondary | ICD-10-CM | POA: Diagnosis not present

## 2018-03-06 DIAGNOSIS — M79676 Pain in unspecified toe(s): Secondary | ICD-10-CM

## 2018-03-08 NOTE — Progress Notes (Signed)
   SUBJECTIVE Patient with a history of diabetes mellitus presents to office today complaining of elongated, thickened nails that cause pain while ambulating in shoes. She is unable to trim her own nails. Patient is here for further evaluation and treatment.   Past Medical History:  Diagnosis Date  . Diabetes mellitus without complication ([place])   . Hyperlipemia   . Hypertension     OBJECTIVE General Patient is awake, alert, and oriented x 3 and in no acute distress. Derm Skin is dry and supple bilateral. Negative open lesions or macerations. Remaining integument unremarkable. Nails are tender, long, thickened and dystrophic with subungual debris, consistent with onychomycosis, 1-5 bilateral. No signs of infection noted. Vasc  DP and PT pedal pulses palpable bilaterally. Temperature gradient within normal limits.  Neuro Epicritic and protective threshold sensation diminished bilaterally.  Musculoskeletal Exam No symptomatic pedal deformities noted bilateral. Muscular strength within normal limits.  ASSESSMENT 1. Diabetes Mellitus w/ peripheral neuropathy 2. Onychomycosis of nail due to dermatophyte bilateral 3. Pain in foot bilateral  PLAN OF CARE 1. Patient evaluated today. 2. Instructed to maintain good pedal hygiene and foot care. Stressed importance of controlling blood sugar.  3. Mechanical debridement of nails 1-5 bilaterally performed using a nail nipper. Filed with dremel without incident.  4. Return to clinic in 3 mos.     Edrick Kins, DPM [facility]  Dr. Edrick Kins, [place]                                        [place], [place] [address]                Office [phone]  Fax [phone]

## 2018-06-02 ENCOUNTER — Ambulatory Visit: Payer: Medicare Other | Admitting: Podiatry

## 2018-06-12 ENCOUNTER — Encounter: Payer: Self-pay | Admitting: Podiatry

## 2018-06-12 ENCOUNTER — Other Ambulatory Visit: Payer: Self-pay

## 2018-06-12 ENCOUNTER — Ambulatory Visit (INDEPENDENT_AMBULATORY_CARE_PROVIDER_SITE_OTHER): Payer: Medicare Other | Admitting: Podiatry

## 2018-06-12 VITALS — Temp 98.2°F

## 2018-06-12 DIAGNOSIS — M79676 Pain in unspecified toe(s): Secondary | ICD-10-CM | POA: Diagnosis not present

## 2018-06-12 DIAGNOSIS — E0842 Diabetes mellitus due to underlying condition with diabetic polyneuropathy: Secondary | ICD-10-CM

## 2018-06-12 DIAGNOSIS — L989 Disorder of the skin and subcutaneous tissue, unspecified: Secondary | ICD-10-CM

## 2018-06-12 DIAGNOSIS — B351 Tinea unguium: Secondary | ICD-10-CM

## 2018-06-15 NOTE — Progress Notes (Signed)
    Subjective: Patient is a 83 y.o. female presenting to the office today with a chief complaint of painful callus lesions noted to the bilateral feet that have been present for the past few months. Walking and bearing weight increases the pain. She has not done anything at home for treatment.  Patient also complains of elongated, thickened nails that cause pain while ambulating in shoes. She is unable to trim her own nails. Patient presents today for further treatment and evaluation.  Past Medical History:  Diagnosis Date  . Diabetes mellitus without complication ([place])   . Hyperlipemia   . Hypertension     Objective:  Physical Exam General: Alert and oriented x3 in no acute distress  Dermatology: Hyperkeratotic lesions present on the bilateral feet. Pain on palpation with a central nucleated core noted. Skin is warm, dry and supple bilateral lower extremities. Negative for open lesions or macerations. Nails are tender, long, thickened and dystrophic with subungual debris, consistent with onychomycosis, 1-5 bilateral. No signs of infection noted.  Vascular: Palpable pedal pulses bilaterally. No edema or erythema noted. Capillary refill within normal limits.  Neurological: Epicritic and protective threshold diminished bilaterally.   Musculoskeletal Exam: Pain on palpation at the keratotic lesion noted. Range of motion within normal limits bilateral. Muscle strength 5/5 in all groups bilateral.  Assessment: 1. Onychodystrophic nails 1-5 bilateral with hyperkeratosis of nails.  2. Onychomycosis of nail due to dermatophyte bilateral 3. Pre-ulcerative callus lesions noted to the bilateral feet   Plan of Care:  1. Patient evaluated. 2. Excisional debridement of keratoic lesion using a chisel blade was performed without incident.  3. Dressed with light dressing. 4. Mechanical debridement of nails 1-5 bilaterally performed using a nail nipper. Filed with dremel without incident.  5.  Patient is to return to the clinic in 3 months.   Edrick Kins, DPM [facility]  Dr. Edrick Kins, [place]                                        [place],  [address]                Office [phone]  Fax [phone]

## 2018-06-27 IMAGING — CR DG CHEST 2V
2 series · 2 of 2 positions shown · non-contrast
Comparison: 07/08/2010

CLINICAL DATA: Syncope; fall. QUAN MARTELLO from home pt states she went
to the bathroom because she felt abd cramping. Per family she was
found on the bathroom floor. Family states she was unresponsive for
a few minutes and drooling on herself. History of breast carcinoma
and hypertension.

EXAM:
CHEST  2 VIEW

[chest pa]
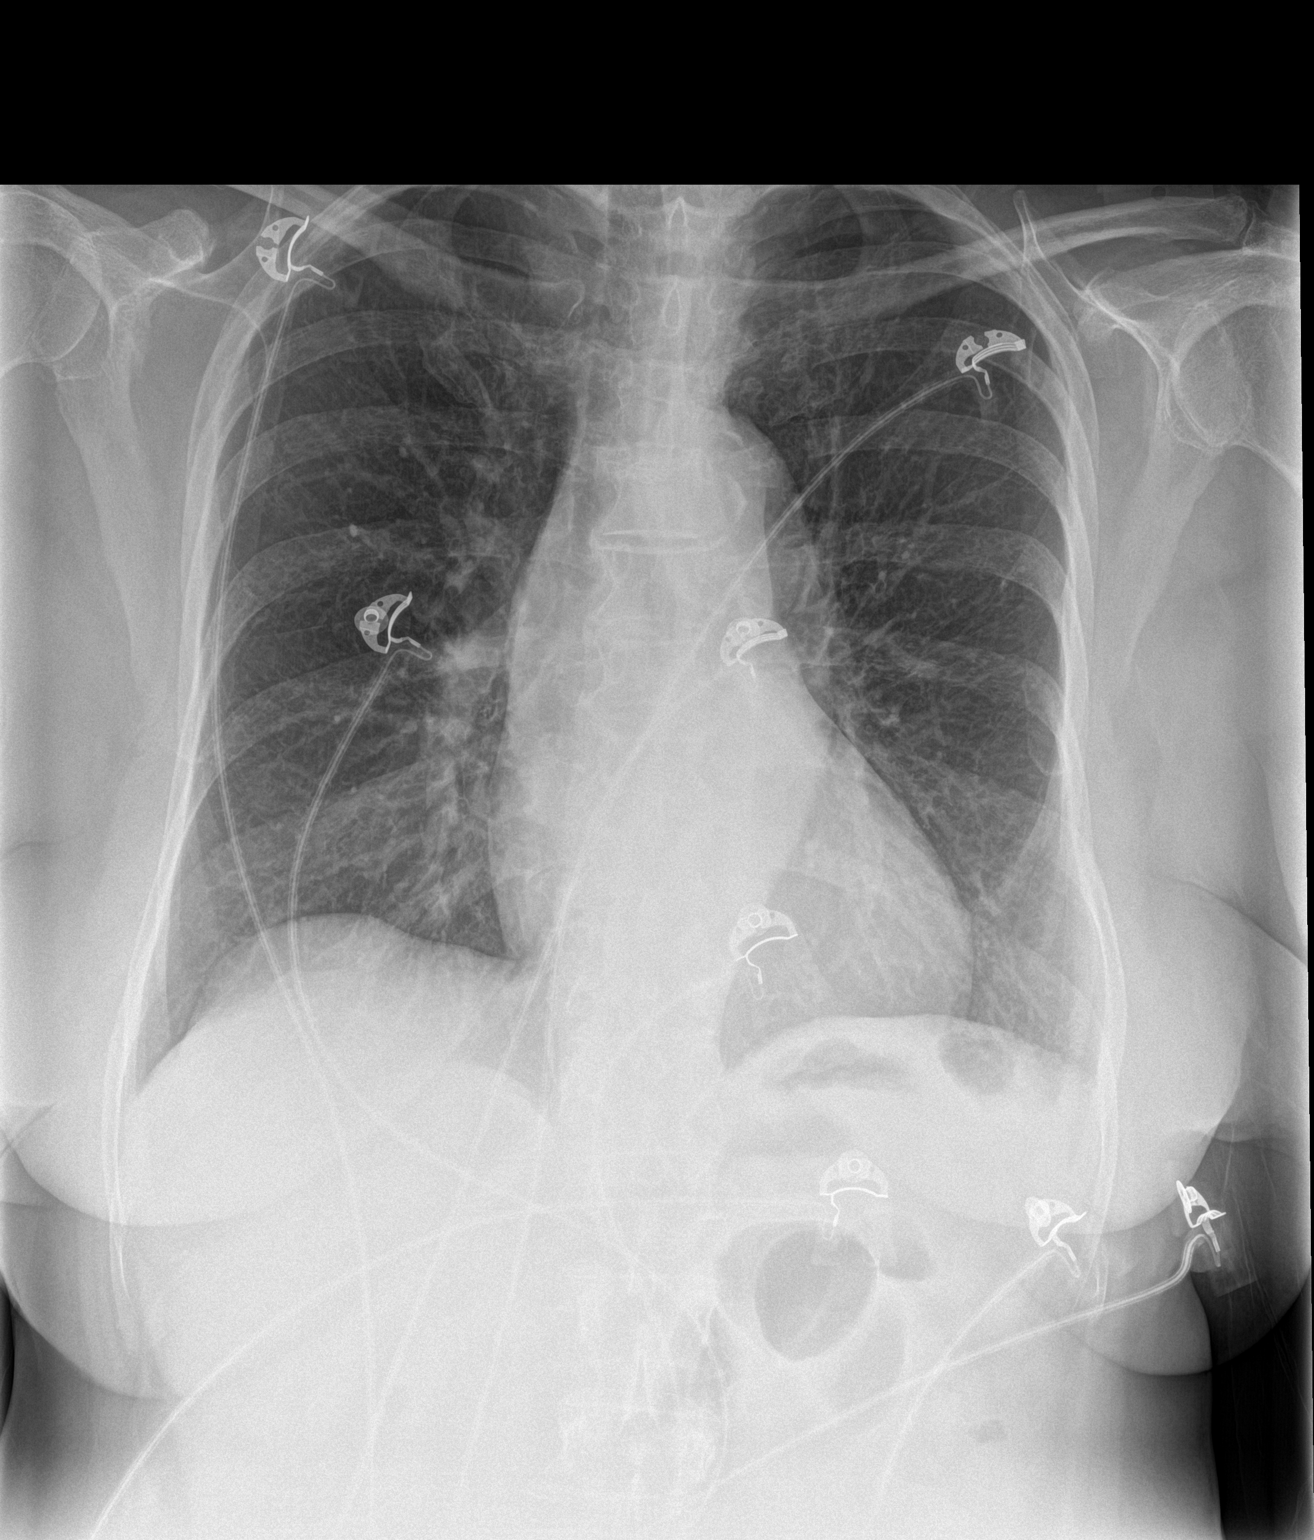

[chest lat]
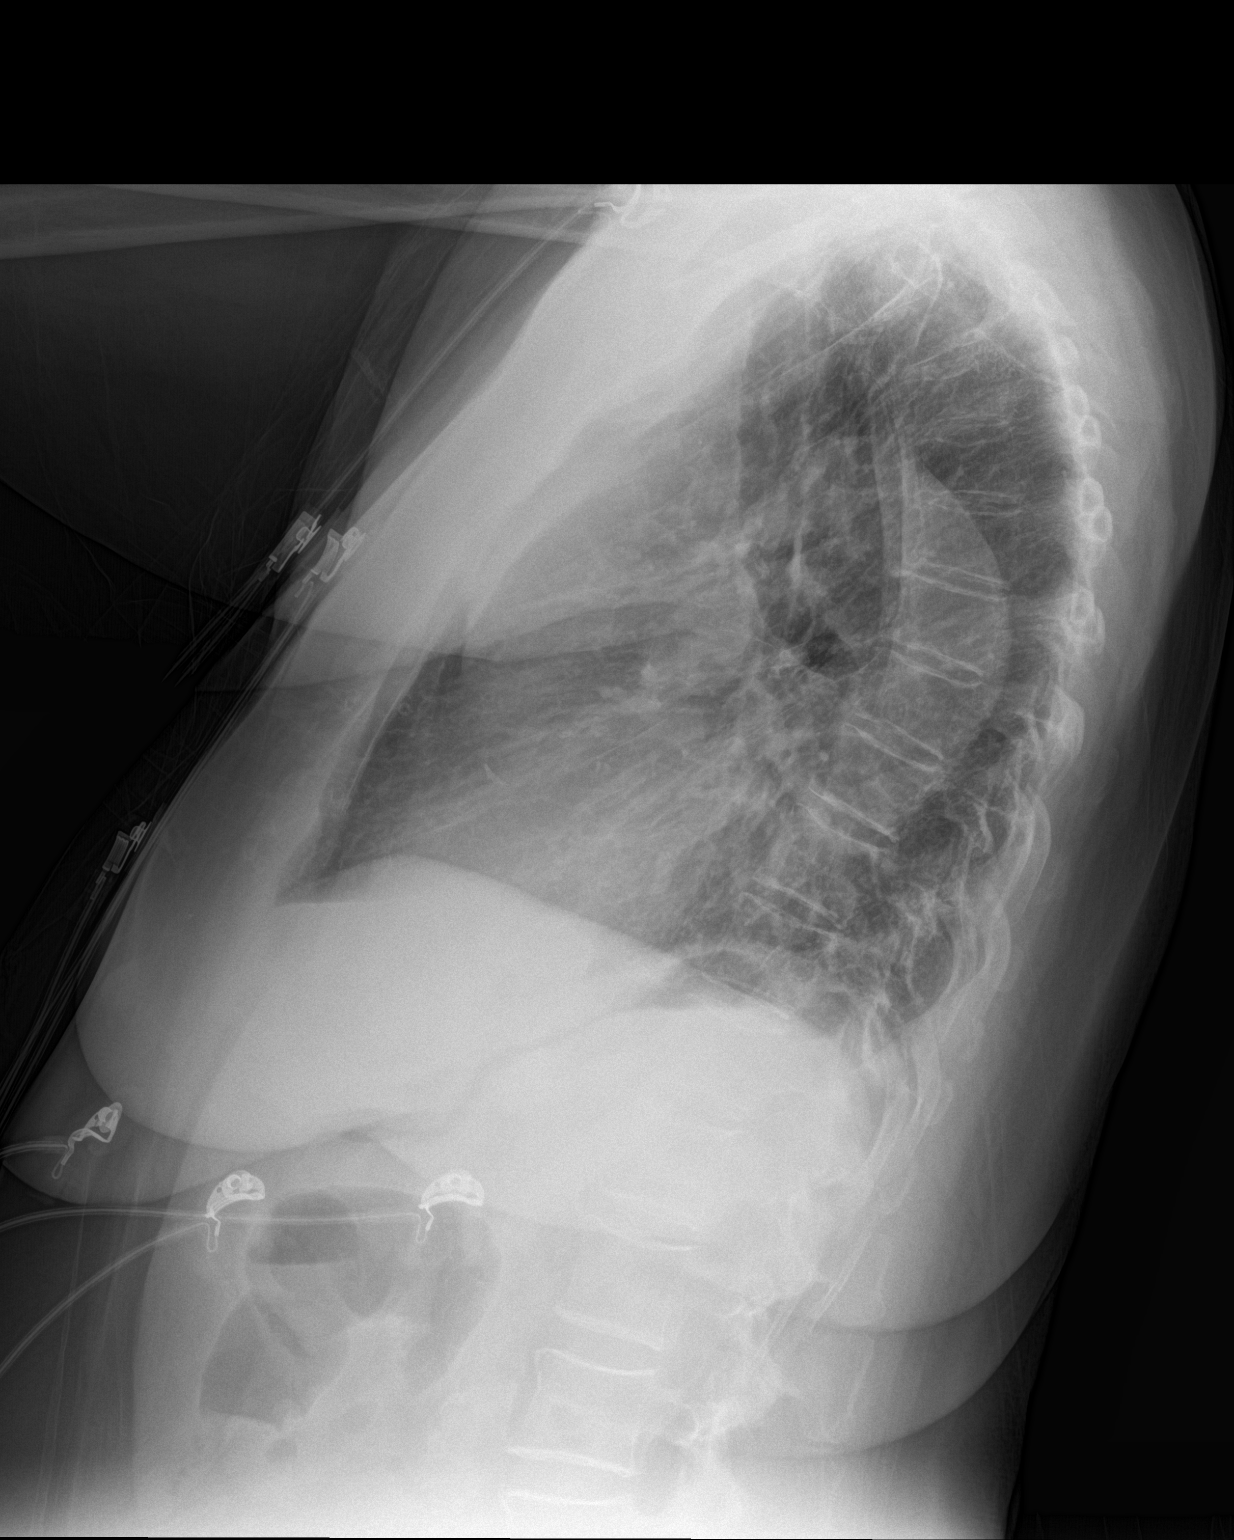

[2 of 2 positions shown; findings below may reference images not displayed]

FINDINGS: Cardiac silhouette is normal in size and configuration. The aorta is
uncoiled. No mediastinal or hilar masses or evidence of adenopathy.

Clear lungs.  No pleural effusion.  No pneumothorax.

Skeletal structures are demineralized but intact.
IMPRESSION: No active cardiopulmonary disease.

## 2018-06-27 IMAGING — CT CT HEAD W/O CM
3 of 4 series · 15 of 47 positions shown, 18 images · non-contrast
Comparison: 07/08/2010

CLINICAL DATA: CARVET BORELAND from home pt states she went to the bathroom
because she felt abd cramping. Per family she was found on the
bathroom floor. Family states she was unresponsive for a few minutes
and drooling on herself. Pt alert and oriented at this time. History
of breast carcinoma.

EXAM:
CT HEAD WITHOUT CONTRAST
TECHNIQUE: Contiguous axial images were obtained from the base of the skull
through the vertex without intravenous contrast.

[Series 2: head wo · axial · 0.40mm/px · z∈[-111,-1]mm · 9 of 26 slices shown, 12 images]
[im 2/26  brain]
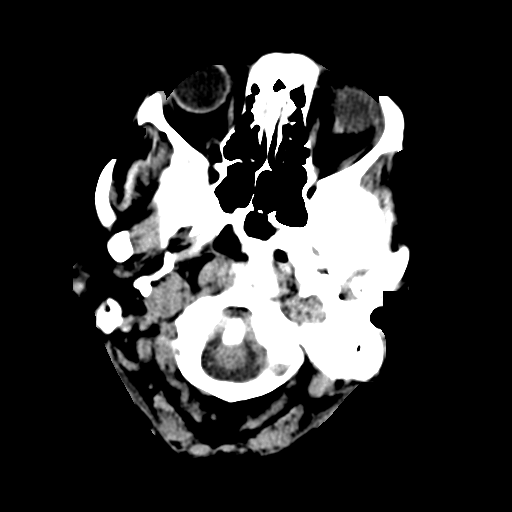
[im 2/26  bone]
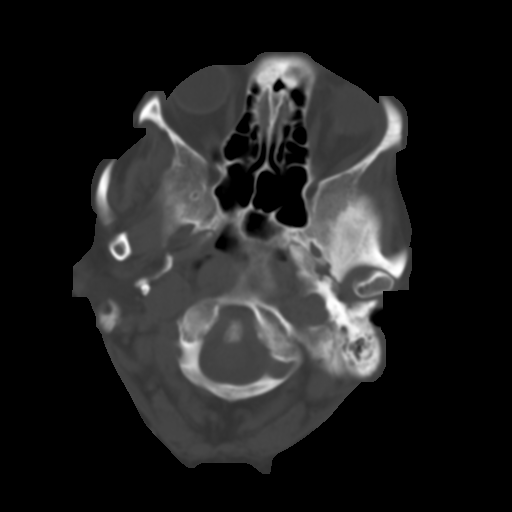
[im 6/26  brain]
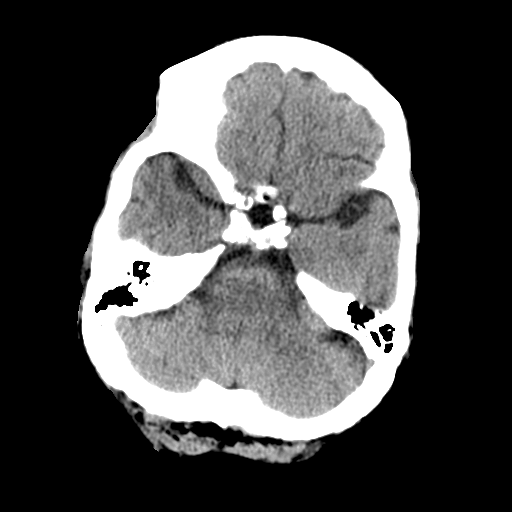
[im 8/26  brain]
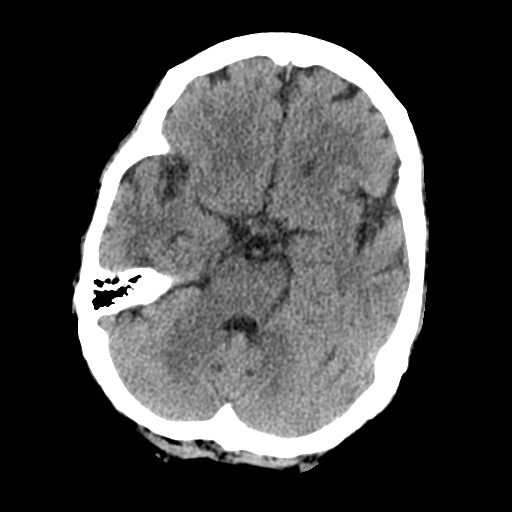
[im 11/26  brain]
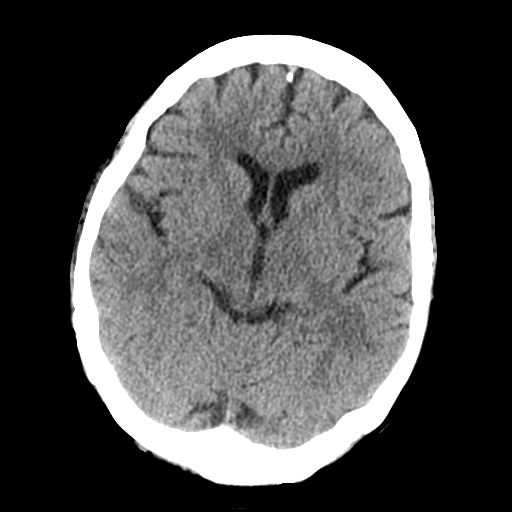
[im 13/26  brain]
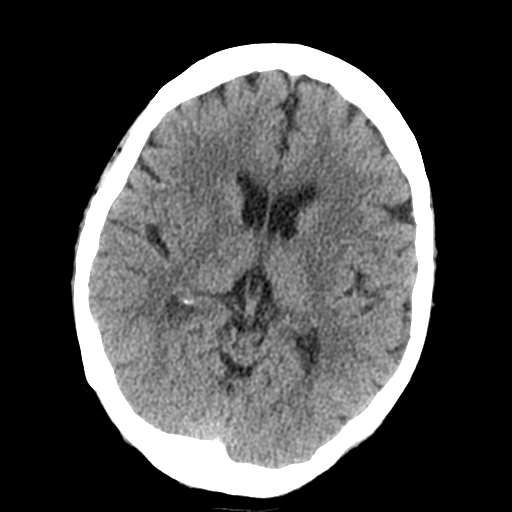
[im 13/26  bone]
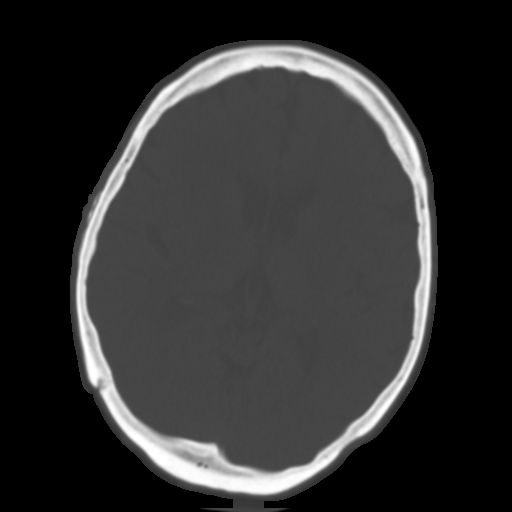
[im 15/26  brain]
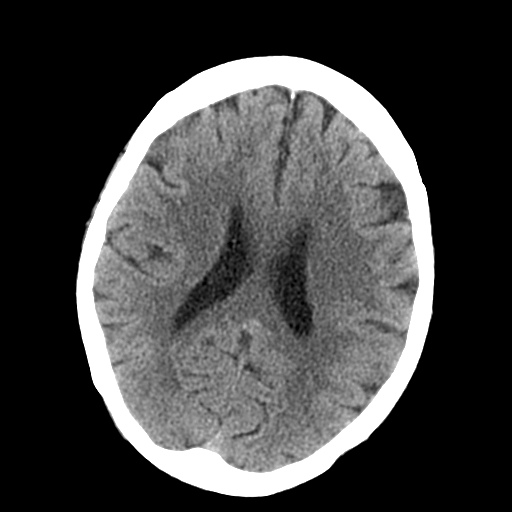
[im 18/26  brain]
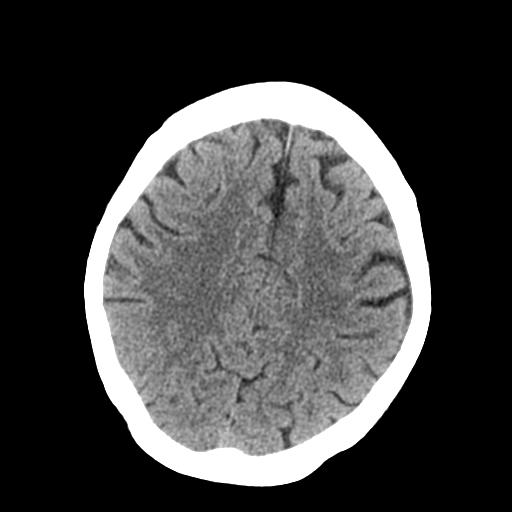
[im 20/26  brain]
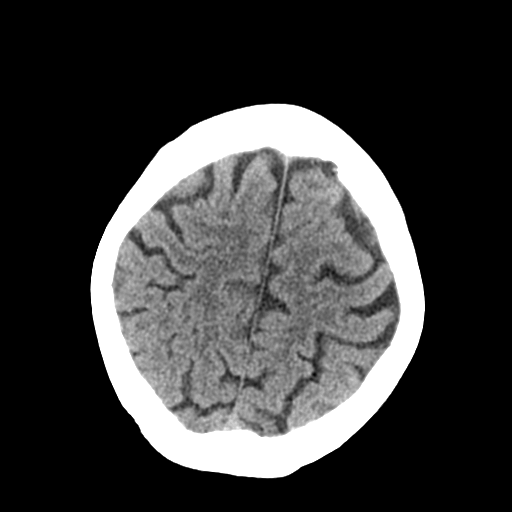
[im 24/26  brain]
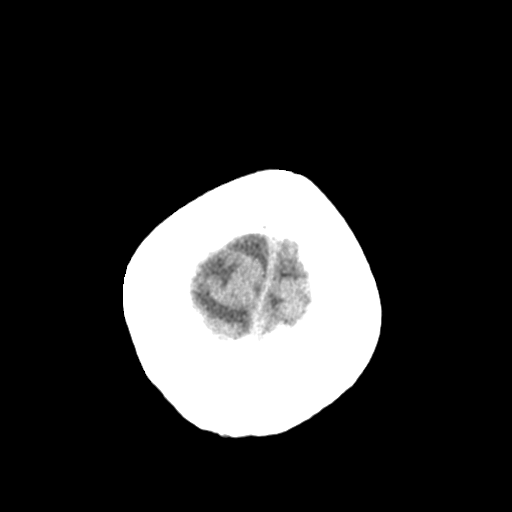
[im 24/26  bone]
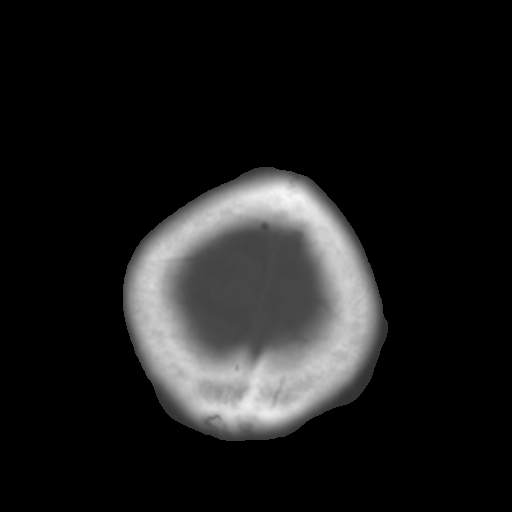

[Series 4: coronal soft tissue · coronal · 0.27mm/px · 3 of 66 slices shown]
[im 22/66  brain]
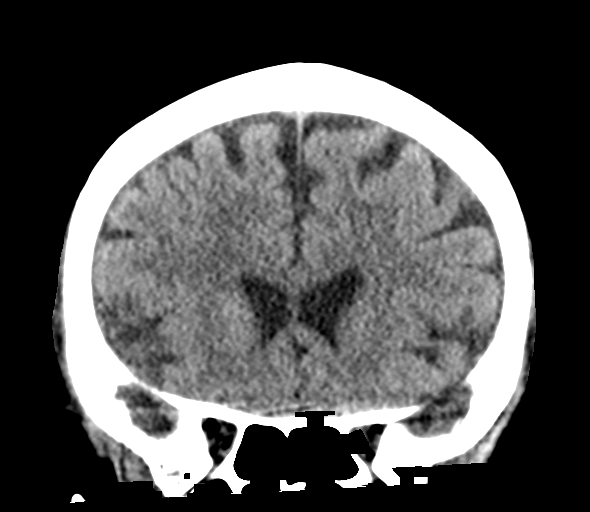
[im 29/66  brain]
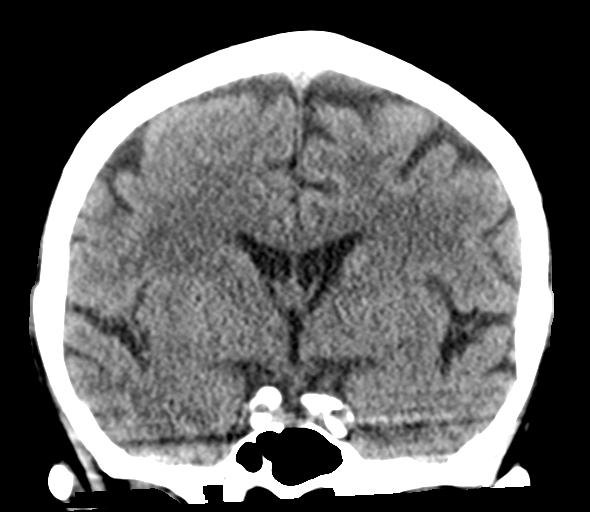
[im 37/66  brain]
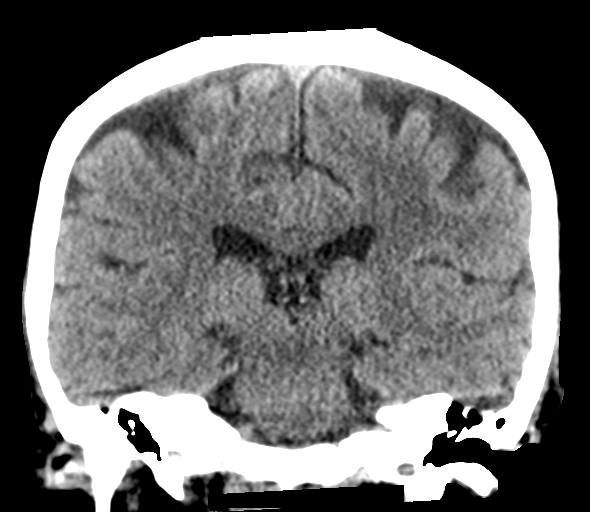

[Series 5: sagittal soft tissue · sagittal · 0.27mm/px · 3 of 52 slices shown]
[im 18/52  brain]
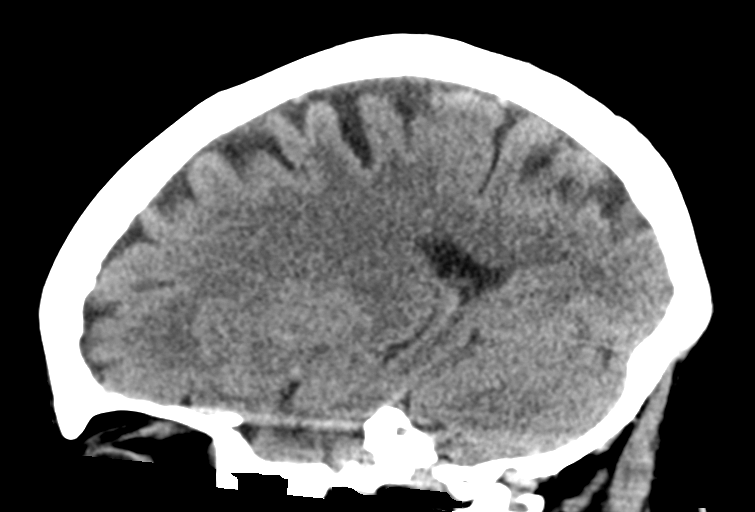
[im 26/52  brain]
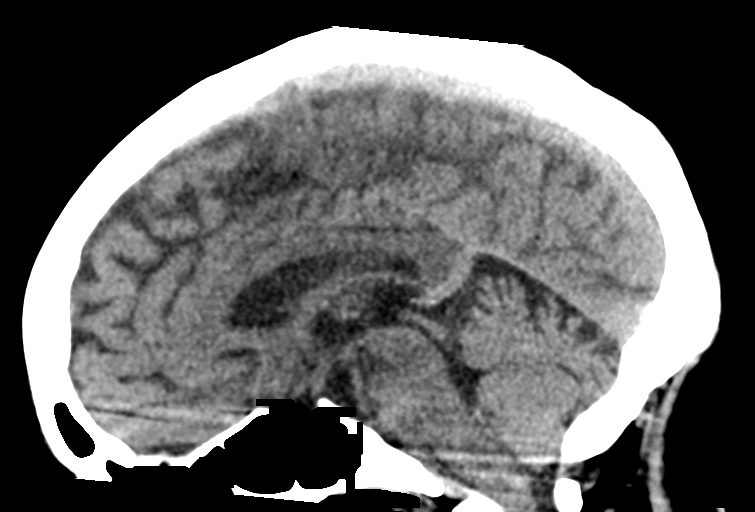
[im 35/52  brain]
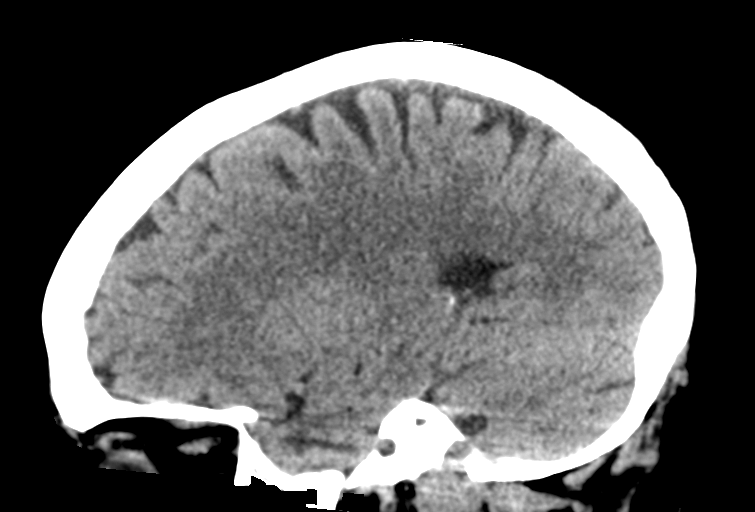

[15 of 47 positions shown; findings below may reference images not displayed]

FINDINGS: Brain: No evidence of acute infarction, hemorrhage, hydrocephalus,
extra-axial collection or mass lesion/mass effect.

Vascular: No hyperdense vessel or unexpected calcification.

Skull: Normal. Negative for fracture or focal lesion.

Sinuses/Orbits: No acute finding.

Other: None.
IMPRESSION: 1. No acute intracranial abnormalities.

## 2018-07-21 DIAGNOSIS — D500 Iron deficiency anemia secondary to blood loss (chronic): Secondary | ICD-10-CM | POA: Insufficient documentation

## 2018-09-08 ENCOUNTER — Encounter: Payer: Self-pay | Admitting: Podiatry

## 2018-09-08 ENCOUNTER — Ambulatory Visit: Payer: Medicare Other | Admitting: Podiatry

## 2018-09-08 ENCOUNTER — Ambulatory Visit (INDEPENDENT_AMBULATORY_CARE_PROVIDER_SITE_OTHER): Payer: Medicare Other | Admitting: Podiatry

## 2018-09-08 ENCOUNTER — Other Ambulatory Visit: Payer: Self-pay

## 2018-09-08 VITALS — Temp 97.7°F

## 2018-09-08 DIAGNOSIS — E0842 Diabetes mellitus due to underlying condition with diabetic polyneuropathy: Secondary | ICD-10-CM

## 2018-09-08 DIAGNOSIS — L989 Disorder of the skin and subcutaneous tissue, unspecified: Secondary | ICD-10-CM

## 2018-09-08 DIAGNOSIS — B351 Tinea unguium: Secondary | ICD-10-CM

## 2018-09-08 DIAGNOSIS — M79676 Pain in unspecified toe(s): Secondary | ICD-10-CM | POA: Diagnosis not present

## 2018-09-11 NOTE — Progress Notes (Signed)
    Subjective: Patient is a 83 y.o. female presenting to the office today for follow up evaluation of painful callus lesions noted to the bilateral feet. Walking and bearing weight increases the pain. She has not had any recent treatment for the symptoms.  Patient also complains of elongated, thickened nails that cause pain while ambulating in shoes. She is unable to trim her own nails. Patient presents today for further treatment and evaluation.  Past Medical History:  Diagnosis Date  . Diabetes mellitus without complication ([place])   . Hyperlipemia   . Hypertension     Objective:  Physical Exam General: Alert and oriented x3 in no acute distress  Dermatology: Hyperkeratotic lesions present on the bilateral feet. Pain on palpation with a central nucleated core noted. Skin is warm, dry and supple bilateral lower extremities. Negative for open lesions or macerations. Nails are tender, long, thickened and dystrophic with subungual debris, consistent with onychomycosis, 1-5 bilateral. No signs of infection noted.  Vascular: Palpable pedal pulses bilaterally. No edema or erythema noted. Capillary refill within normal limits.  Neurological: Epicritic and protective threshold diminished bilaterally.   Musculoskeletal Exam: Pain on palpation at the keratotic lesion noted. Range of motion within normal limits bilateral. Muscle strength 5/5 in all groups bilateral.  Assessment: 1. Onychodystrophic nails 1-5 bilateral with hyperkeratosis of nails.  2. Onychomycosis of nail due to dermatophyte bilateral 3. Pre-ulcerative callus lesions noted to the bilateral feet   Plan of Care:  1. Patient evaluated. 2. Excisional debridement of keratoic lesion using a chisel blade was performed without incident.  3. Dressed with light dressing. 4. Mechanical debridement of nails 1-5 bilaterally performed using a nail nipper. Filed with dremel without incident.  5. Patient is to return to the clinic in 3  months.   Edrick Kins, DPM [facility]  Dr. Edrick Kins, [place]                                        [place], [place] [address]                Office [phone]  Fax [phone]

## 2019-01-12 ENCOUNTER — Ambulatory Visit: Payer: Medicare Other | Admitting: Podiatry

## 2019-01-15 ENCOUNTER — Other Ambulatory Visit: Payer: Self-pay

## 2019-01-15 ENCOUNTER — Ambulatory Visit (INDEPENDENT_AMBULATORY_CARE_PROVIDER_SITE_OTHER): Payer: Medicare Other | Admitting: Podiatry

## 2019-01-15 ENCOUNTER — Encounter: Payer: Self-pay | Admitting: Podiatry

## 2019-01-15 DIAGNOSIS — M79676 Pain in unspecified toe(s): Secondary | ICD-10-CM | POA: Diagnosis not present

## 2019-01-15 DIAGNOSIS — E0842 Diabetes mellitus due to underlying condition with diabetic polyneuropathy: Secondary | ICD-10-CM

## 2019-01-15 DIAGNOSIS — B351 Tinea unguium: Secondary | ICD-10-CM

## 2019-01-15 DIAGNOSIS — L989 Disorder of the skin and subcutaneous tissue, unspecified: Secondary | ICD-10-CM

## 2019-01-19 NOTE — Progress Notes (Signed)
    Subjective: Patient is a 83 y.o. female presenting to the office today for follow up evaluation of painful callus lesions noted to the bilateral feet. Walking and bearing weight increases the pain. She has not had any recent treatment for the symptoms.  Patient also complains of elongated, thickened nails that cause pain while ambulating in shoes. She is unable to trim her own nails. Patient presents today for further treatment and evaluation.  Past Medical History:  Diagnosis Date  . Diabetes mellitus without complication ([place])   . Hyperlipemia   . Hypertension     Objective:  Physical Exam General: Alert and oriented x3 in no acute distress  Dermatology: Hyperkeratotic lesions present on the bilateral feet. Pain on palpation with a central nucleated core noted. Skin is warm, dry and supple bilateral lower extremities. Negative for open lesions or macerations. Nails are tender, long, thickened and dystrophic with subungual debris, consistent with onychomycosis, 1-5 bilateral. No signs of infection noted.  Vascular: Palpable pedal pulses bilaterally. No edema or erythema noted. Capillary refill within normal limits.  Neurological: Epicritic and protective threshold diminished bilaterally.   Musculoskeletal Exam: Pain on palpation at the keratotic lesion noted. Range of motion within normal limits bilateral. Muscle strength 5/5 in all groups bilateral.  Assessment: 1. Onychodystrophic nails 1-5 bilateral with hyperkeratosis of nails.  2. Onychomycosis of nail due to dermatophyte bilateral 3. Pre-ulcerative callus lesions noted to the bilateral feet   Plan of Care:  1. Patient evaluated. 2. Excisional debridement of keratoic lesion using a chisel blade was performed without incident.  3. Dressed with light dressing. 4. Mechanical debridement of nails 1-5 bilaterally performed using a nail nipper. Filed with dremel without incident.  5. Patient is to return to the clinic in 3  months.   Edrick Kins, DPM [facility]  Dr. Edrick Kins, [place]                                        [place], [place] [address]                Office [phone]  Fax [phone]

## 2019-04-16 ENCOUNTER — Encounter: Payer: Self-pay | Admitting: Podiatry

## 2019-04-16 ENCOUNTER — Other Ambulatory Visit: Payer: Self-pay

## 2019-04-16 ENCOUNTER — Ambulatory Visit (INDEPENDENT_AMBULATORY_CARE_PROVIDER_SITE_OTHER): Payer: Medicare Other | Admitting: Podiatry

## 2019-04-16 DIAGNOSIS — M79676 Pain in unspecified toe(s): Secondary | ICD-10-CM | POA: Diagnosis not present

## 2019-04-16 DIAGNOSIS — E0842 Diabetes mellitus due to underlying condition with diabetic polyneuropathy: Secondary | ICD-10-CM | POA: Diagnosis not present

## 2019-04-16 DIAGNOSIS — B351 Tinea unguium: Secondary | ICD-10-CM | POA: Diagnosis not present

## 2019-04-16 DIAGNOSIS — L989 Disorder of the skin and subcutaneous tissue, unspecified: Secondary | ICD-10-CM | POA: Diagnosis not present

## 2019-04-19 NOTE — Progress Notes (Signed)
    Subjective: Patient is a 84 y.o. female presenting to the office today for follow up evaluation of painful callus lesions noted to the bilateral feet. Walking and bearing weight increases the pain. She has not had any recent treatment for the symptoms.  Patient also complains of elongated, thickened nails that cause pain while ambulating in shoes. She is unable to trim her own nails. Patient presents today for further treatment and evaluation.  Past Medical History:  Diagnosis Date  . Diabetes mellitus without complication ([place])   . Hyperlipemia   . Hypertension     Objective:  Physical Exam General: Alert and oriented x3 in no acute distress  Dermatology: Hyperkeratotic lesions present on the bilateral feet. Pain on palpation with a central nucleated core noted. Skin is warm, dry and supple bilateral lower extremities. Negative for open lesions or macerations. Nails are tender, long, thickened and dystrophic with subungual debris, consistent with onychomycosis, 1-5 bilateral. No signs of infection noted.  Vascular: Palpable pedal pulses bilaterally. No edema or erythema noted. Capillary refill within normal limits.  Neurological: Epicritic and protective threshold diminished bilaterally.   Musculoskeletal Exam: Pain on palpation at the keratotic lesion noted. Range of motion within normal limits bilateral. Muscle strength 5/5 in all groups bilateral.  Assessment: 1. Onychodystrophic nails 1-5 bilateral with hyperkeratosis of nails.  2. Onychomycosis of nail due to dermatophyte bilateral 3. Pre-ulcerative callus lesions noted to the bilateral feet   Plan of Care:  1. Patient evaluated. 2. Excisional debridement of keratoic lesion using a chisel blade was performed without incident.  3. Dressed with light dressing. 4. Mechanical debridement of nails 1-5 bilaterally performed using a nail nipper. Filed with dremel without incident.  5. Patient is to return to the clinic in 3  months.   Edrick Kins, DPM [facility]  Dr. Edrick Kins, [place]                                        [place], [place] [address]                Office [phone]  Fax [phone]

## 2019-07-20 ENCOUNTER — Ambulatory Visit: Payer: Medicare Other | Admitting: Podiatry

## 2019-09-17 ENCOUNTER — Other Ambulatory Visit: Payer: Self-pay

## 2019-09-17 ENCOUNTER — Ambulatory Visit (INDEPENDENT_AMBULATORY_CARE_PROVIDER_SITE_OTHER): Payer: Medicare Other | Admitting: Podiatry

## 2019-09-17 DIAGNOSIS — M79676 Pain in unspecified toe(s): Secondary | ICD-10-CM | POA: Diagnosis not present

## 2019-09-17 DIAGNOSIS — B351 Tinea unguium: Secondary | ICD-10-CM

## 2019-09-17 DIAGNOSIS — E0842 Diabetes mellitus due to underlying condition with diabetic polyneuropathy: Secondary | ICD-10-CM

## 2019-09-17 DIAGNOSIS — L989 Disorder of the skin and subcutaneous tissue, unspecified: Secondary | ICD-10-CM | POA: Diagnosis not present

## 2019-09-17 NOTE — Progress Notes (Signed)
    Subjective: Patient is a 84 y.o. female presenting to the office today for follow up evaluation of painful callus lesions noted to the bilateral feet. Walking and bearing weight increases the pain. She has not had any recent treatment for the symptoms.  Patient also complains of elongated, thickened nails that cause pain while ambulating in shoes. She is unable to trim her own nails. Patient presents today for further treatment and evaluation.  Past Medical History:  Diagnosis Date  . Diabetes mellitus without complication ([place])   . Hyperlipemia   . Hypertension     Objective:  Physical Exam General: Alert and oriented x3 in no acute distress  Dermatology: Hyperkeratotic lesions present on the bilateral feet. Pain on palpation with a central nucleated core noted. Skin is warm, dry and supple bilateral lower extremities. Negative for open lesions or macerations. Nails are tender, long, thickened and dystrophic with subungual debris, consistent with onychomycosis, 1-5 bilateral. No signs of infection noted.  Vascular: Palpable pedal pulses bilaterally. No edema or erythema noted. Capillary refill within normal limits.  Neurological: Epicritic and protective threshold diminished bilaterally.   Musculoskeletal Exam: Pain on palpation at the keratotic lesion noted. Range of motion within normal limits bilateral. Muscle strength 5/5 in all groups bilateral.  Assessment: 1. Onychodystrophic nails 1-5 bilateral with hyperkeratosis of nails.  2. Onychomycosis of nail due to dermatophyte bilateral 3. Pre-ulcerative callus lesions noted to the bilateral feet   Plan of Care:  1. Patient evaluated. 2. Excisional debridement of keratoic lesion using a chisel blade was performed without incident.  3. Dressed with light dressing. 4. Mechanical debridement of nails 1-5 bilaterally performed using a nail nipper. Filed with dremel without incident.  5. Patient is to return to the clinic in 3  months.   Edrick Kins, DPM [facility]  Dr. Edrick Kins, [address]                                        [place], [place] [address]                Office [phone]  Fax [phone]

## 2019-12-21 ENCOUNTER — Ambulatory Visit: Payer: Medicare Other | Admitting: Podiatry

## 2019-12-24 ENCOUNTER — Ambulatory Visit: Payer: Medicare Other | Admitting: Podiatry

## 2021-03-23 DIAGNOSIS — E782 Mixed hyperlipidemia: Secondary | ICD-10-CM | POA: Insufficient documentation

## 2021-03-23 DIAGNOSIS — I421 Obstructive hypertrophic cardiomyopathy: Secondary | ICD-10-CM | POA: Insufficient documentation

## 2021-05-29 ENCOUNTER — Ambulatory Visit (INDEPENDENT_AMBULATORY_CARE_PROVIDER_SITE_OTHER): Payer: Medicare Other | Admitting: Podiatry

## 2021-05-29 DIAGNOSIS — L989 Disorder of the skin and subcutaneous tissue, unspecified: Secondary | ICD-10-CM | POA: Diagnosis not present

## 2021-05-29 DIAGNOSIS — M79676 Pain in unspecified toe(s): Secondary | ICD-10-CM | POA: Diagnosis not present

## 2021-05-29 DIAGNOSIS — B351 Tinea unguium: Secondary | ICD-10-CM

## 2021-05-29 DIAGNOSIS — E0842 Diabetes mellitus due to underlying condition with diabetic polyneuropathy: Secondary | ICD-10-CM | POA: Diagnosis not present

## 2021-05-29 NOTE — Progress Notes (Signed)
? ? ?  Subjective: ?Patient is a 86 y.o. female presenting to the office today for follow up evaluation of painful callus lesions noted to the bilateral feet. Walking and bearing weight increases the pain. She has not had any recent treatment for the symptoms.  ?Patient also complains of elongated, thickened nails that cause pain while ambulating in shoes. She is unable to trim her own nails. Patient presents today for further treatment and evaluation. ? ?Past Medical History:  ?Diagnosis Date  ? Diabetes mellitus without complication ([place])   ? Hyperlipemia   ? Hypertension   ? ? ?Objective:  ?Physical Exam ?General: Alert and oriented x3 in no acute distress ? ?Dermatology: Hyperkeratotic lesions present on the bilateral feet. Pain on palpation with a central nucleated core noted. Skin is warm, dry and supple bilateral lower extremities. Negative for open lesions or macerations. Nails are tender, long, thickened and dystrophic with subungual debris, consistent with onychomycosis, 1-5 bilateral. No signs of infection noted. ? ?Vascular: Palpable pedal pulses bilaterally. No edema or erythema noted. Capillary refill within normal limits. ? ?Neurological: Epicritic and protective threshold diminished bilaterally.  ? ?Musculoskeletal Exam: Pain on palpation at the keratotic lesion noted. Range of motion within normal limits bilateral. Muscle strength 5/5 in all groups bilateral. ? ?Assessment: ?1. Onychodystrophic nails 1-5 bilateral with hyperkeratosis of nails.  ?2. Onychomycosis of nail due to dermatophyte bilateral ?3. Pre-ulcerative callus lesions noted to the bilateral feet ? ? ?Plan of Care:  ?1. Patient evaluated. ?2. Excisional debridement of keratoic lesion using a chisel blade was performed without incident as a courtesy for the patient.  ?3. Dressed with light dressing. ?4. Mechanical debridement of nails 1-5 bilaterally performed using a nail nipper. Filed with dremel without incident.  ?5. Patient is to  return to the clinic in 3 months.  ? ?Edrick Kins, DPM ?[place] ? ?Dr. Edrick Kins, DPM  ?  ?[address]                                       ?[place], [place] [address]                ?Office [phone]  ?Fax [phone] ? ? ?

## 2021-08-28 ENCOUNTER — Ambulatory Visit (INDEPENDENT_AMBULATORY_CARE_PROVIDER_SITE_OTHER): Payer: Medicare Other | Admitting: Podiatry

## 2021-08-28 DIAGNOSIS — M79676 Pain in unspecified toe(s): Secondary | ICD-10-CM

## 2021-08-28 DIAGNOSIS — B351 Tinea unguium: Secondary | ICD-10-CM | POA: Diagnosis not present

## 2021-08-28 NOTE — Progress Notes (Signed)
    Subjective: Patient is a 86 y.o. female presenting to the office today for follow up evaluation of painful callus lesions noted to the bilateral feet. Walking and bearing weight increases the pain. She has not had any recent treatment for the symptoms.  Patient also complains of elongated, thickened nails that cause pain while ambulating in shoes. She is unable to trim her own nails. Patient presents today for further treatment and evaluation.  Past Medical History:  Diagnosis Date   Diabetes mellitus without complication ([place])    Hyperlipemia    Hypertension     Objective:  Physical Exam General: Alert and oriented x3 in no acute distress  Dermatology: Hyperkeratotic lesions present on the bilateral feet. Pain on palpation with a central nucleated core noted. Skin is warm, dry and supple bilateral lower extremities. Negative for open lesions or macerations. Nails are tender, long, thickened and dystrophic with subungual debris, consistent with onychomycosis, 1-5 bilateral. No signs of infection noted.  Vascular: Palpable pedal pulses bilaterally. No edema or erythema noted. Capillary refill within normal limits.  Neurological: Epicritic and protective threshold diminished bilaterally.   Musculoskeletal Exam: Pain on palpation at the keratotic lesion noted. Range of motion within normal limits bilateral. Muscle strength 5/5 in all groups bilateral.  Assessment: 1. Onychodystrophic nails 1-5 bilateral with hyperkeratosis of nails.  2. Onychomycosis of nail due to dermatophyte bilateral 3. Pre-ulcerative callus lesions noted to the bilateral feet   Plan of Care:  1. Patient evaluated. 2. Excisional debridement of keratoic lesion using a chisel blade was performed without incident as a courtesy for the patient.  3. Dressed with light dressing. 4. Mechanical debridement of nails 1-5 bilaterally performed using a nail nipper. Filed with dremel without incident.  5. Patient is to  return to the clinic in 3 months.   Edrick Kins, DPM [facility]  Dr. Edrick Kins, DPM    [address], [place] [address]                Office [phone]  Fax [phone]

## 2021-10-30 ENCOUNTER — Ambulatory Visit (INDEPENDENT_AMBULATORY_CARE_PROVIDER_SITE_OTHER): Payer: Medicare Other | Admitting: Podiatry

## 2021-10-30 DIAGNOSIS — B351 Tinea unguium: Secondary | ICD-10-CM

## 2021-10-30 DIAGNOSIS — M79676 Pain in unspecified toe(s): Secondary | ICD-10-CM

## 2021-10-30 NOTE — Progress Notes (Signed)
   No chief complaint on file.   Subjective: Patient is a 86 y.o. female presenting to the office today for follow up evaluation of painful callus lesions noted to the bilateral feet. Walking and bearing weight increases the pain. She has not had any recent treatment for the symptoms.  Patient also complains of elongated, thickened nails that cause pain while ambulating in shoes. She is unable to trim her own nails. Patient presents today for further treatment and evaluation.  Past Medical History:  Diagnosis Date   Diabetes mellitus without complication ([place])    Hyperlipemia    Hypertension     Objective:  Physical Exam General: Alert and oriented x3 in no acute distress  Dermatology: Hyperkeratotic lesions present on the bilateral feet. Pain on palpation with a central nucleated core noted. Skin is warm, dry and supple bilateral lower extremities. Negative for open lesions or macerations. Nails are tender, long, thickened and dystrophic with subungual debris, consistent with onychomycosis, 1-5 bilateral. No signs of infection noted.  Vascular: Palpable pedal pulses bilaterally. No edema or erythema noted. Capillary refill within normal limits.  Neurological: Epicritic and protective threshold diminished bilaterally.   Musculoskeletal Exam: Pain on palpation at the keratotic lesion noted. Range of motion within normal limits bilateral. Muscle strength 5/5 in all groups bilateral.  Assessment: 1. Onychodystrophic nails 1-5 bilateral with hyperkeratosis of nails.  2. Onychomycosis of nail due to dermatophyte bilateral 3. Pre-ulcerative callus lesions noted to the bilateral feet   Plan of Care:  1. Patient evaluated. 2. Excisional debridement of keratoic lesion using a chisel blade was performed without incident as a courtesy for the patient.  3. Dressed with light dressing. 4. Mechanical debridement of nails 1-5 bilaterally performed using a nail nipper. Filed with dremel without  incident.  5. Patient is to return to the clinic in 3 months.   Edrick Kins, DPM [facility]  Dr. Edrick Kins, DPM    [address], [place] [address]                Office [phone]  Fax [phone]

## 2022-01-29 ENCOUNTER — Ambulatory Visit: Payer: Medicare Other | Admitting: Podiatry

## 2022-02-05 ENCOUNTER — Ambulatory Visit: Payer: Medicare Other | Admitting: Podiatry

## 2022-06-25 ENCOUNTER — Other Ambulatory Visit: Payer: Self-pay

## 2022-06-25 ENCOUNTER — Emergency Department: Payer: 59

## 2022-06-25 ENCOUNTER — Inpatient Hospital Stay (HOSPITAL_COMMUNITY)
Admit: 2022-06-25 | Discharge: 2022-06-25 | Disposition: A | Discharge status: Home / Self Care | Payer: 59 | Attending: Family Medicine | Admitting: Family Medicine

## 2022-06-25 ENCOUNTER — Observation Stay
Admission: EM | Admit: 2022-06-25 | Discharge: 2022-06-26 | Disposition: A | Discharge status: Home / Self Care | Payer: 59 | Attending: Internal Medicine | Admitting: Internal Medicine

## 2022-06-25 ENCOUNTER — Encounter: Payer: Self-pay | Admitting: Family Medicine

## 2022-06-25 DIAGNOSIS — E119 Type 2 diabetes mellitus without complications: Secondary | ICD-10-CM | POA: Diagnosis not present

## 2022-06-25 DIAGNOSIS — R197 Diarrhea, unspecified: Principal | ICD-10-CM | POA: Insufficient documentation

## 2022-06-25 DIAGNOSIS — Z794 Long term (current) use of insulin: Secondary | ICD-10-CM

## 2022-06-25 DIAGNOSIS — Z7984 Long term (current) use of oral hypoglycemic drugs: Secondary | ICD-10-CM | POA: Diagnosis not present

## 2022-06-25 DIAGNOSIS — R2681 Unsteadiness on feet: Secondary | ICD-10-CM | POA: Insufficient documentation

## 2022-06-25 DIAGNOSIS — R296 Repeated falls: Secondary | ICD-10-CM | POA: Insufficient documentation

## 2022-06-25 DIAGNOSIS — M6281 Muscle weakness (generalized): Secondary | ICD-10-CM | POA: Diagnosis not present

## 2022-06-25 DIAGNOSIS — E785 Hyperlipidemia, unspecified: Secondary | ICD-10-CM | POA: Diagnosis not present

## 2022-06-25 DIAGNOSIS — E1169 Type 2 diabetes mellitus with other specified complication: Secondary | ICD-10-CM

## 2022-06-25 DIAGNOSIS — E876 Hypokalemia: Secondary | ICD-10-CM | POA: Diagnosis not present

## 2022-06-25 DIAGNOSIS — Z7982 Long term (current) use of aspirin: Secondary | ICD-10-CM | POA: Insufficient documentation

## 2022-06-25 DIAGNOSIS — R55 Syncope and collapse: Secondary | ICD-10-CM

## 2022-06-25 DIAGNOSIS — F0390 Unspecified dementia without behavioral disturbance: Secondary | ICD-10-CM

## 2022-06-25 DIAGNOSIS — I10 Essential (primary) hypertension: Secondary | ICD-10-CM | POA: Diagnosis not present

## 2022-06-25 DIAGNOSIS — Z79899 Other long term (current) drug therapy: Secondary | ICD-10-CM | POA: Insufficient documentation

## 2022-06-25 DIAGNOSIS — G309 Alzheimer's disease, unspecified: Secondary | ICD-10-CM | POA: Diagnosis not present

## 2022-06-25 DIAGNOSIS — F0280 Dementia in other diseases classified elsewhere without behavioral disturbance: Secondary | ICD-10-CM | POA: Diagnosis present

## 2022-06-25 LAB — CBG MONITORING, ED
Glucose-Capillary: 383 mg/dL — ABNORMAL HIGH (ref 70–99)
Glucose-Capillary: 285 mg/dL — ABNORMAL HIGH (ref 70–99)

## 2022-06-25 LAB — COMPREHENSIVE METABOLIC PANEL
ALT: 10 U/L (ref 0–44)
AST: 19 U/L (ref 15–41)
Albumin: 3.7 g/dL (ref 3.5–5.0)
Alkaline Phosphatase: 94 U/L (ref 38–126)
Anion gap: 10 (ref 5–15)
BUN: 23 mg/dL (ref 8–23)
CO2: 27 mmol/L (ref 22–32)
Calcium: 9.6 mg/dL (ref 8.9–10.3)
Chloride: 103 mmol/L (ref 98–111)
Creatinine, Ser: 0.94 mg/dL (ref 0.44–1.00)
GFR, Estimated: 60 mL/min — ABNORMAL LOW (ref >60)
Glucose, Bld: 258 mg/dL — ABNORMAL HIGH (ref 70–99)
Potassium: 3.5 mmol/L (ref 3.5–5.1)
Sodium: 140 mmol/L (ref 135–145)
Total Bilirubin: 0.7 mg/dL (ref 0.3–1.2)
Total Protein: 7.8 g/dL (ref 6.5–8.1)

## 2022-06-25 LAB — URINALYSIS, ROUTINE W REFLEX MICROSCOPIC
Bacteria, UA: NONE SEEN
Bilirubin Urine: NEGATIVE
Glucose, UA: NEGATIVE mg/dL
Hgb urine dipstick: NEGATIVE
Ketones, ur: NEGATIVE mg/dL
Nitrite: NEGATIVE
Protein, ur: NEGATIVE mg/dL
Specific Gravity, Urine: 1.012 (ref 1.005–1.030)
pH: 6 (ref 5.0–8.0)

## 2022-06-25 LAB — CBC WITH DIFFERENTIAL/PLATELET
Abs Immature Granulocytes: 0.05 10*3/uL (ref 0.00–0.07)
Basophils Absolute: 0 10*3/uL (ref 0.0–0.1)
Basophils Relative: 0 %
Eosinophils Absolute: 0 10*3/uL (ref 0.0–0.5)
Eosinophils Relative: 1 %
HCT: 43 % (ref 36.0–46.0)
Hemoglobin: 13.7 g/dL (ref 12.0–15.0)
Immature Granulocytes: 1 %
Lymphocytes Relative: 21 %
Lymphs Abs: 1.8 10*3/uL (ref 0.7–4.0)
MCH: 30 pg (ref 26.0–34.0)
MCHC: 31.9 g/dL (ref 30.0–36.0)
MCV: 94.3 fL (ref 80.0–100.0)
Monocytes Absolute: 0.4 10*3/uL (ref 0.1–1.0)
Monocytes Relative: 5 %
Neutro Abs: 6.5 10*3/uL (ref 1.7–7.7)
Neutrophils Relative %: 72 %
Platelets: 154 10*3/uL (ref 150–400)
RBC: 4.56 MIL/uL (ref 3.87–5.11)
RDW: 13.2 % (ref 11.5–15.5)
WBC: 8.9 10*3/uL (ref 4.0–10.5)
nRBC: 0 % (ref 0.0–0.2)

## 2022-06-25 LAB — ECHOCARDIOGRAM COMPLETE
AR max vel: 3.34 cm2
AV Area VTI: 2.85 cm2
AV Area mean vel: 3.23 cm2
AV Mean grad: 9.7 mmHg
AV Peak grad: 19.9 mmHg
Ao pk vel: 2.23 m/s
Area-P 1/2: 3.03 cm2
Est EF: >75
Height: 65 in
MV VTI: 3.67 cm2
S' Lateral: 2.1 cm
Weight: 2469.15 oz

## 2022-06-25 LAB — TROPONIN I (HIGH SENSITIVITY)
Troponin I (High Sensitivity): 10 ng/L (ref <18)
Troponin I (High Sensitivity): 10 ng/L (ref <18)

## 2022-06-25 LAB — HEMOGLOBIN A1C
Hgb A1c MFr Bld: 10 % — ABNORMAL HIGH (ref 4.8–5.6)
Mean Plasma Glucose: 240 mg/dL

## 2022-06-25 LAB — GLUCOSE, CAPILLARY
Glucose-Capillary: 187 mg/dL — ABNORMAL HIGH (ref 70–99)
Glucose-Capillary: 125 mg/dL — ABNORMAL HIGH (ref 70–99)

## 2022-06-25 MED ORDER — SODIUM CHLORIDE 0.9 % IV SOLN: INTRAVENOUS | Status: DC

## 2022-06-25 MED ORDER — ONDANSETRON HCL 4 MG/2ML IJ SOLN: 4.0000 mg | Freq: Four times a day (QID) | INTRAMUSCULAR | Status: DC | PRN

## 2022-06-25 MED ORDER — AMLODIPINE BESYLATE 5 MG PO TABS
5.0000 mg | ORAL_TABLET | Freq: Every day | ORAL | Status: DC
  Administered 2022-06-26: 5 mg via ORAL
  Filled 2022-06-25: qty 1

## 2022-06-25 MED ORDER — INSULIN ASPART 100 UNIT/ML IJ SOLN
3.0000 [IU] | Freq: Three times a day (TID) | INTRAMUSCULAR | Status: DC
  Administered 2022-06-25 – 2022-06-26 (×4): 3 [IU] via SUBCUTANEOUS
  Filled 2022-06-25 (×4): qty 1

## 2022-06-25 MED ORDER — SIMVASTATIN 20 MG PO TABS
20.0000 mg | ORAL_TABLET | Freq: Every day | ORAL | Status: DC
  Administered 2022-06-25: 20 mg via ORAL
  Filled 2022-06-25: qty 1

## 2022-06-25 MED ORDER — SODIUM CHLORIDE 0.9% FLUSH
3.0000 mL | Freq: Two times a day (BID) | INTRAVENOUS | Status: DC
  Administered 2022-06-25: 3 mL via INTRAVENOUS

## 2022-06-25 MED ORDER — ONDANSETRON HCL 4 MG PO TABS: 4.0000 mg | ORAL_TABLET | Freq: Four times a day (QID) | ORAL | Status: DC | PRN

## 2022-06-25 MED ORDER — INSULIN ASPART 100 UNIT/ML IJ SOLN
0.0000 [IU] | Freq: Three times a day (TID) | INTRAMUSCULAR | Status: DC
  Administered 2022-06-25: 2 [IU] via SUBCUTANEOUS
  Administered 2022-06-25: 9 [IU] via SUBCUTANEOUS
  Administered 2022-06-26 (×2): 2 [IU] via SUBCUTANEOUS
  Filled 2022-06-25 (×4): qty 1

## 2022-06-25 MED ORDER — DONEPEZIL HCL 5 MG PO TABS
5.0000 mg | ORAL_TABLET | ORAL | Status: DC
  Administered 2022-06-26: 5 mg via ORAL
  Filled 2022-06-25: qty 1

## 2022-06-25 MED ORDER — ASPIRIN 81 MG PO TBEC
81.0000 mg | DELAYED_RELEASE_TABLET | ORAL | Status: DC
  Administered 2022-06-25: 81 mg via ORAL
  Filled 2022-06-25: qty 1

## 2022-06-25 MED ORDER — ENOXAPARIN SODIUM 40 MG/0.4ML IJ SOSY
40.0000 mg | PREFILLED_SYRINGE | INTRAMUSCULAR | Status: DC
  Administered 2022-06-25 – 2022-06-26 (×2): 40 mg via SUBCUTANEOUS
  Filled 2022-06-25 (×2): qty 0.4

## 2022-06-25 MED ORDER — SODIUM CHLORIDE 0.9 % IV SOLN: Freq: Once | INTRAVENOUS | Status: AC

## 2022-06-25 NOTE — Assessment & Plan Note (Signed)
Continue Aricept 

## 2022-06-25 NOTE — ED Notes (Signed)
Pt to MRI via stretcher at this time.

## 2022-06-25 NOTE — ED Notes (Signed)
Patient assisted to restroom with help of this RN and daughter. Patient helped back into bed and repositioned. Patient sitting up eating breakfast tray. Given diet ginger ale as requested. No other needs expressed at this time.

## 2022-06-25 NOTE — ED Triage Notes (Signed)
Arrrives via [employer] from home for ground level fall.  Upon EMS arrival patient was laying pool of liquid feces.  Patient denied striking head, loss of consciousness or any complaints of possible injuries.  Family was insistent that patient be sent to hospital for further evaluation.  Patient denies any shortness of breath, chest pain, lightheadedness/dizziness, or nausea/vomiting.  Endorses cough, however states this has been present for weeks now.

## 2022-06-25 NOTE — Progress Notes (Signed)
*  PRELIMINARY RESULTS* Echocardiogram 2D Echocardiogram has been performed.  Carolyne Fiscal 06/25/2022, 1:31 PM

## 2022-06-25 NOTE — Assessment & Plan Note (Signed)
Positive syncopal episode at home associated with large bowel movement Suspect vasovagal episode Check orthostatics CT head grossly stable apart from old cerebellar infarct Nonfocal neuroexam MRI pending Add on 2D echo Gentle IV fluids Follow

## 2022-06-25 NOTE — Evaluation (Signed)
Physical Therapy Evaluation Patient Details Name: Alexis Oconnell MRN: [id number] DOB: 07-Aug-1935 Today's Date: 06/25/2022  History of Present Illness  Pt is an 87 y.o. female with medical history significant of type 2 diabetes, hypertension, hyperlipidemia, Alzheimer's disease presenting with syncope.  Patient reports having large diarrhea-like bowel movement overnight.  Patient reports having significant weakness and dizziness after standing and passing out.  MD assessment includes: syncope with suspected vasovagal episode.  Per MRI impression: "No acute finding.  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 Pt's orthostatic BPs taken as follows: supine 138/77, sitting 177/89, and standing 150/110, MD notified.  Pt asymptomatic throughout the session.  Pt amb to/from toilet in ER with further amb deferred secondary to elevated diastolic BP.  Pt generally steady with standing/ambulating with no overt LOB and with SpO2 and HR WNL on room air and required no physical assist with bed mobility and transfers.  Pt will benefit from continued PT services upon discharge to safely address deficits listed in patient problem list for decreased caregiver assistance and eventual return to PLOF.         Recommendations for follow up therapy are one component of a multi-disciplinary discharge planning process, led by the attending physician.  Recommendations may be updated based on patient status, additional functional criteria and insurance authorization.  Follow Up Recommendations       Assistance Recommended at Discharge Intermittent Supervision/Assistance  Patient can return home with the following  A little help with walking and/or transfers;A little help with bathing/dressing/bathroom;Assistance with cooking/housework;Assist for transportation;Help with stairs or ramp for  entrance    Equipment Recommendations Rolling walker (2 wheels)  Recommendations for Other Services       Functional Status Assessment Patient has had a recent decline in their functional status and demonstrates the ability to make significant improvements in function in a reasonable and predictable amount of time.     Precautions / Restrictions Precautions Precautions: Fall Restrictions Weight Bearing Restrictions: No      Mobility  Bed Mobility Overal bed mobility: Modified Independent             General bed mobility comments: Extra time and effort only    Transfers Overall transfer level: Needs assistance Equipment used: Rolling walker (2 wheels) Transfers: Sit to/from Stand Sit to Stand: Supervision           General transfer comment: Good eccentric and concentric control from elevated height but extra time, effort, and use of grab bar from low height    Ambulation/Gait Ambulation/Gait assistance: Min guard Gait Distance (Feet): 12 Feet x 2 Assistive device: Rolling walker (2 wheels) Gait Pattern/deviations: Step-through pattern, Decreased step length - right, Decreased step length - left Gait velocity: decreased     General Gait Details: Slow cadence with short B step length but generally steady with no overt LOB  Stairs            Wheelchair Mobility    Modified Rankin (Stroke Patients Only)       Balance Overall balance assessment: Needs assistance Sitting-balance support: No upper extremity supported, Feet unsupported Sitting balance-Leahy Scale: Good     Standing balance support: Bilateral upper extremity supported, During functional activity Standing balance-Leahy Scale: Good  Pertinent Vitals/Pain Pain Assessment Pain Assessment: No/denies pain    Home Living Family/patient expects to be discharged to:: Private residence Living Arrangements: Children Available Help at Discharge:  Family;Available 24 hours/day Type of Home: Apartment Home Access: Stairs to enter Entrance Stairs-Rails: None Entrance Stairs-Number of Steps: 2   Home Layout: One level Home Equipment: BSC/3in1;Cane - single point Additional Comments: Lives with son    Prior Function Prior Level of Function : Independent/Modified Independent             Mobility Comments: Ind amb without AD in the home, Mod Ind amb community distances with a SPC in the community, no other falls in the last 6 months other than current fall secondary to syncope ADLs Comments: Ind with ADLs     Hand Dominance        Extremity/Trunk Assessment   Upper Extremity Assessment Upper Extremity Assessment: Generalized weakness    Lower Extremity Assessment Lower Extremity Assessment: Generalized weakness       Communication   Communication: No difficulties  Cognition Arousal/Alertness: Awake/alert Behavior During Therapy: WFL for tasks assessed/performed Overall Cognitive Status: Within Functional Limits for tasks assessed                                          General Comments      Exercises     Assessment/Plan    PT Assessment Patient needs continued PT services  PT Problem List Decreased strength;Decreased activity tolerance;Decreased balance;Decreased mobility;Decreased knowledge of use of DME       PT Treatment Interventions DME instruction;Gait training;Stair training;Functional mobility training;Therapeutic activities;Therapeutic exercise;Balance training;Patient/family education    PT Goals (Current goals can be found in the Care Plan section)  Acute Rehab PT Goals Patient Stated Goal: To return home PT Goal Formulation: With patient Time For Goal Achievement: 07/08/22 Potential to Achieve Goals: Good    Frequency Min 3X/week     Co-evaluation               AM-PAC PT "6 Clicks" Mobility  Outcome Measure Help needed turning from your back to your side  while in a flat bed without using bedrails?: None Help needed moving from lying on your back to sitting on the side of a flat bed without using bedrails?: None Help needed moving to and from a bed to a chair (including a wheelchair)?: A Little Help needed standing up from a chair using your arms (e.g., wheelchair or bedside chair)?: A Little Help needed to walk in hospital room?: A Little Help needed climbing 3-5 steps with a railing? : A Little 6 Click Score: 20    End of Session Equipment Utilized During Treatment: Gait belt Activity Tolerance: Patient tolerated treatment well Patient left: in bed;with family/visitor present;with call bell/phone within reach Nurse Communication: Mobility status;Other (comment) (Orthostatic BPs communitcated to MD and nursing) PT Visit Diagnosis: Muscle weakness (generalized) (M62.81);Difficulty in walking, not elsewhere classified (R26.2)    Time: 1610-9604 PT Time Calculation (min) (ACUTE ONLY): 27 min   Charges:   PT Evaluation $PT Eval Moderate Complexity: 1 Mod         D. Scott Lella Mullany PT, DPT 06/25/22, 3:34 PM

## 2022-06-25 NOTE — Evaluation (Signed)
Occupational Therapy Evaluation Patient Details Name: Alexis Oconnell MRN: [id number] DOB: 26-Jan-1936 Today's Date: 06/25/2022   History of Present Illness 87 y.o. female with medical history significant of type 2 diabetes, hypertension, hyperlipidemia, Alzheimer's disease presenting with syncope.  Patient reports having large diarrhea-like bowel movement overnight.  Patient reports having significant weakness and dizziness after standing and passing out.  Patient denies any prior episodes like this recently.  Some reported straining with bowel movement.  No chest pain or shortness of breath.  No hemiparesis or confusion.  Had worsening weakness after the event.  EMS subsequently called patient being noted to be found lying in her own feces.No fx's this admission.   Clinical Impression   Patient presenting with decreased Ind in self care,balance, functional mobility/transfers, endurance, and safety awareness. Patient reports being Mod I at baseline and living with son. Pt uses SPC outside of the home and endorses being Ind in self care tasks and shares IADLs with her son. Her son takes her to appointments and shopping as needed. Patient currently functioning at supervision - min guard for functional tasks. Pt ambulates in room with close supervision without AD to bathroom. Pt needing min guard for toilet transfer and balance when standing to complete hygiene and clothing management. HR does increase to 130's with functional activity. Patient will benefit from acute OT to increase overall independence in the areas of ADLs, functional mobility, and safety awareness in order to safely discharge.     Recommendations for follow up therapy are one component of a multi-disciplinary discharge planning process, led by the attending physician.  Recommendations may be updated based on patient status, additional functional criteria and insurance authorization.   Assistance Recommended at Discharge Intermittent  Supervision/Assistance  Patient can return home with the following A little help with walking and/or transfers;A little help with bathing/dressing/bathroom;Assistance with cooking/housework;Assist for transportation;Help with stairs or ramp for entrance    Functional Status Assessment  Patient has had a recent decline in their functional status and demonstrates the ability to make significant improvements in function in a reasonable and predictable amount of time.  Equipment Recommendations  BSC/3in1;Other (comment) (Pt reports current BSC is broken)       Precautions / Restrictions Precautions Precautions: Fall      Mobility Bed Mobility Overal bed mobility: Needs Assistance Bed Mobility: Sit to Supine, Supine to Sit     Supine to sit: Min assist Sit to supine: Supervision   General bed mobility comments: assistance with trunk support from ED stretcher    Transfers Overall transfer level: Needs assistance Equipment used: 1 person hand held assist Transfers: Sit to/from Stand Sit to Stand: Supervision, Min guard                  Balance Overall balance assessment: Needs assistance Sitting-balance support: Feet supported Sitting balance-Leahy Scale: Good     Standing balance support: No upper extremity supported, During functional activity Standing balance-Leahy Scale: Fair                             ADL either performed or assessed with clinical judgement   ADL Overall ADL's : Needs assistance/impaired                         Toilet Transfer: Data processing manager and Hygiene: Min guard       Functional mobility during ADLs: Supervision/safety;Min  guard       Vision Patient Visual Report: No change from baseline              Pertinent Vitals/Pain Pain Assessment Pain Assessment: No/denies pain     Hand Dominance     Extremity/Trunk Assessment Upper Extremity Assessment Upper  Extremity Assessment: Generalized weakness   Lower Extremity Assessment Lower Extremity Assessment: Generalized weakness       Communication Communication Communication: No difficulties   Cognition Arousal/Alertness: Awake/alert Behavior During Therapy: WFL for tasks assessed/performed                                   General Comments: Pt with some confusion but overall WFLs and oriented appropriately. Pt is pleasant and cooperative throughout and follows commands with increased time to process.                Home Living Family/patient expects to be discharged to:: Private residence Living Arrangements: Children Available Help at Discharge: Family;Available 24 hours/day Type of Home: Apartment Home Access: Stairs to enter Entrance Stairs-Number of Steps: 2 Entrance Stairs-Rails: None Home Layout: One level     Bathroom Shower/Tub: Walk-in shower         Home Equipment: BSC/3in1;Cane - single point          Prior Functioning/Environment Prior Level of Function : Independent/Modified Independent               ADLs Comments: Pt ambulates with SPC outside of home and without AD inside. Pt endorses Ind in self care tasks and shared IADL tasks with son. Family sets up medicine.        OT Problem List: Decreased strength;Decreased activity tolerance;Decreased safety awareness;Impaired balance (sitting and/or standing);Decreased knowledge of use of DME or AE      OT Treatment/Interventions: Self-care/ADL training;Therapeutic exercise;Therapeutic activities;DME and/or AE instruction;Patient/family education;Balance training;Energy conservation    OT Goals(Current goals can be found in the care plan section) Acute Rehab OT Goals Patient Stated Goal: to return home with family OT Goal Formulation: With patient/family Time For Goal Achievement: 07/09/22 Potential to Achieve Goals: Fair ADL Goals Pt Will Perform Grooming: with modified  independence;standing Pt Will Perform Lower Body Dressing: with modified independence;sit to/from stand Pt Will Transfer to Toilet: with modified independence;ambulating Pt Will Perform Toileting - Clothing Manipulation and hygiene: with modified independence;sit to/from stand  OT Frequency: Min 1X/week       AM-PAC OT "6 Clicks" Daily Activity     Outcome Measure Help from another person eating meals?: None Help from another person taking care of personal grooming?: None Help from another person toileting, which includes using toliet, bedpan, or urinal?: A Little Help from another person bathing (including washing, rinsing, drying)?: A Little Help from another person to put on and taking off regular upper body clothing?: None Help from another person to put on and taking off regular lower body clothing?: A Little 6 Click Score: 21   End of Session    Activity Tolerance: Patient tolerated treatment well Patient left: in bed;with call bell/phone within reach;with family/visitor present  OT Visit Diagnosis: Unsteadiness on feet (R26.81);Repeated falls (R29.6);Muscle weakness (generalized) (M62.81)                Time: 6295-2841 OT Time Calculation (min): 18 min Charges:  OT General Charges $OT Visit: 1 Visit OT Evaluation $OT Eval Moderate Complexity: 1 Mod OT Treatments $Self Care/Home Management :  8-22 mins  Jackquline Denmark, MS, OTR/L , CBIS ascom [phone]  06/25/22, 10:29 AM

## 2022-06-25 NOTE — ED Notes (Signed)
OT at bedside working with patient

## 2022-06-25 NOTE — Assessment & Plan Note (Signed)
BP stable Titrate home regimen with syncopal evaluation

## 2022-06-25 NOTE — ED Notes (Signed)
ECHO at bedside.

## 2022-06-25 NOTE — Assessment & Plan Note (Signed)
SSI A1c 

## 2022-06-25 NOTE — H&P (Signed)
History and Physical    Patient: Alexis Oconnell ZOX:[id number] DOB: Dec 06, 1935 DOA: 06/25/2022 DOS: the patient was seen and examined on 06/25/2022 PCP: [facility]  Patient coming from: Home  Chief Complaint:  Chief Complaint  Patient presents with   Fall   Diarrhea   HPI: Alexis Oconnell is a 87 y.o. female with medical history significant of type 2 diabetes, hypertension, hyperlipidemia, Alzheimer's disease presenting with syncope.  Patient reports having large diarrhea-like bowel movement overnight.  Patient reports having significant weakness and dizziness after standing and passing out.  Patient denies any prior episodes like this recently.  Some reported straining with bowel movement.  No chest pain or shortness of breath.  No hemiparesis or confusion.  Had worsening weakness after the event.  EMS subsequently called patient being noted to be found lying in her own feces.  No recent medication changes.  Noted evaluation back in March showed an [facility] system for URI. No reported cough or SOB. No abdominal pain. No black or bloody bowel movements.  Presented to the ER afebrile, hemodynamically stable.  White count 8.9, hemoglobin 13.7, platelets 154, creatinine 0.94, glucose 258.  Urinalysis grossly stable.  CT head and CT C-spine grossly stable.  Chest x-ray within normal. Review of Systems: As mentioned in the history of present illness. All other systems reviewed and are negative. Past Medical History:  Diagnosis Date   Diabetes mellitus without complication (HCC)    Hyperlipemia    Hypertension    Past Surgical History:  Procedure Laterality Date   BREAST BIOPSY Left 2015   CORE W/CLIP - NEG   Social History:  reports that she has never smoked. She has never used smokeless tobacco. She reports that she does not drink alcohol and does not use drugs.  No Known Allergies  Family History  Problem Relation Age of Onset   Breast cancer Neg Hx     Prior to Admission  medications   Medication Sig Start Date End Date Taking? Authorizing Provider  amLODipine (NORVASC) 5 MG tablet TAKE 1 TABLET EVERY DAY IN AM NEEDS APPT FURTHER REFILLS 09/02/16   [provider]  aspirin EC 81 MG tablet Take 81 mg by mouth every other day.     [provider]  donepezil (ARICEPT) 5 MG tablet Take 5 mg by mouth every morning.     [provider]  Fe Fum-FA-B Cmp-C-Zn-Mg-Mn-Cu (HEMOCYTE PLUS) 106-1 MG CAPS Take 1 capsule by mouth daily. 10/09/15   [provider]  gabapentin (NEURONTIN) 100 MG capsule Take 100 mg by mouth 2 times daily.    [provider]  glipiZIDE (GLUCOTROL) 5 MG tablet Take by mouth. 07/17/17   [provider]  IRON PO Take by mouth. 08/16/16   [provider]  lisinopril-hydrochlorothiazide (PRINZIDE,ZESTORETIC) 20-25 MG tablet Take 1 tablet by mouth every morning. 10/11/13   [provider]  metFORMIN (GLUCOPHAGE) 1000 MG tablet Take 1,000 mg by mouth 2 (two) times daily with a meal. 02/02/18   [provider]  metFORMIN (GLUCOPHAGE) 500 MG tablet Take 500 mg by mouth 2 times daily. 10/11/13   [provider]  prochlorperazine (COMPAZINE) 10 MG tablet Take 10 mg by mouth every morning.  01/27/15   [provider]  simvastatin (ZOCOR) 20 MG tablet Take 20 mg by mouth every night at bedtime. 10/11/13   [provider]  traMADol (ULTRAM) 50 MG tablet Take 50 mg by mouth every 8 (eight) hours as needed for  moderate pain or severe pain.  03/02/14   [provider]    Physical Exam: Vitals:   06/25/22 1610 06/25/22 0338 06/25/22 0722  BP: (!) 178/77  137/83  Pulse: 97  78  Resp: 18  16  Temp: 98.3 F (36.8 C)    TempSrc: Oral    SpO2: 98%  94%  Weight:  70 kg   Height:   5\' 5"  (1.651 m)   Physical Exam Constitutional:      Appearance: She is normal weight.  HENT:     Head: Normocephalic and atraumatic.     Nose: Nose normal.     Mouth/Throat:      Mouth: Mucous membranes are moist.  Eyes:     Pupils: Pupils are equal, round, and reactive to light.  Cardiovascular:     Rate and Rhythm: Normal rate and regular rhythm.  Pulmonary:     Effort: Pulmonary effort is normal.  Abdominal:     General: Bowel sounds are normal.  Musculoskeletal:        General: Normal range of motion.     Cervical back: Normal range of motion.  Skin:    General: Skin is dry.  Neurological:     General: No focal deficit present.  Psychiatric:        Mood and Affect: Mood normal.     Data Reviewed:  There are no new results to review at this time. MR BRAIN WO CONTRAST CLINICAL DATA:  Ground level fall.  EXAM: MRI HEAD WITHOUT CONTRAST  TECHNIQUE: Multiplanar, multiecho pulse sequences of the brain and surrounding structures were obtained without intravenous contrast.  COMPARISON:  Head CT from earlier today  FINDINGS: Brain: No acute infarction, hemorrhage, hydrocephalus, extra-axial collection or mass lesion. Small remote infarcts in the right cerebellum. Mild for age chronic small vessel ischemia in the cerebral white matter. Age normal brain volume.  Vascular: Normal flow voids.  Skull and upper cervical spine: Normal marrow signal.  Sinuses/Orbits: Negative.  IMPRESSION: 1. No acute finding.  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 2. Mild chronic small vessel ischemia in the supratentorial white matter.  Electronically Signed   By: Tiburcio Pea M.D.   On: 06/25/2022 07:13 DG Chest Port 1 View CLINICAL DATA:  Ground level fall.  Cough.  EXAM: PORTABLE CHEST 1 VIEW  COMPARISON:  10/28/2015  FINDINGS: Normal heart size and mediastinal contours. No acute infiltrate or edema. No effusion or pneumothorax. No acute osseous findings.  IMPRESSION: No evidence of active disease.  Electronically Signed   By: Tiburcio Pea M.D.   On: 06/25/2022 05:07 CT Cervical Spine Wo Contrast CLINICAL DATA:  Neck  trauma  EXAM: CT HEAD WITHOUT CONTRAST  CT CERVICAL SPINE WITHOUT CONTRAST  TECHNIQUE: Multidetector CT imaging of the head and cervical spine was performed following the standard protocol without intravenous contrast. Multiplanar CT image reconstructions of the cervical spine were also generated.  RADIATION DOSE REDUCTION: This exam was performed according to the departmental dose-optimization program which includes automated exposure control, adjustment of the mA and/or kV according to patient size and/or use of iterative reconstruction technique.  COMPARISON:  Head CT 10/28/2015  FINDINGS: CT HEAD FINDINGS  Brain: No evidence of swelling, hemorrhage, hydrocephalus, extra-axial collection or mass lesion/mass effect. Small right inferior cerebellar infarct which is intermediate density. Normal brain volume.  Vascular: No hyperdense vessel or unexpected calcification.  Skull: Normal. Negative for fracture or focal lesion.  Sinuses/Orbits: No acute finding.  CT CERVICAL SPINE FINDINGS  Alignment: No traumatic malalignment  Skull base and vertebrae: No acute fracture. No primary bone lesion or focal pathologic process.  Soft tissues and spinal canal: No prevertebral fluid or swelling. No visible canal hematoma.  Disc levels:  Ordinary degenerative spurring which is mild for age  Upper chest: No evidence of injury  IMPRESSION: 1. No evidence of acute intracranial or cervical spine injury. 2. Small right cerebellar infarct which is age indeterminate based on intermediate density.  Electronically Signed   By: Tiburcio Pea M.D.   On: 06/25/2022 04:22 CT Head Wo Contrast CLINICAL DATA:  Neck trauma  EXAM: CT HEAD WITHOUT CONTRAST  CT CERVICAL SPINE WITHOUT CONTRAST  TECHNIQUE: Multidetector CT imaging of the head and cervical spine was performed following the standard protocol without intravenous contrast. Multiplanar CT image reconstructions of the  cervical spine were also generated.  RADIATION DOSE REDUCTION: This exam was performed according to the departmental dose-optimization program which includes automated exposure control, adjustment of the mA and/or kV according to patient size and/or use of iterative reconstruction technique.  COMPARISON:  Head CT 10/28/2015  FINDINGS: CT HEAD FINDINGS  Brain: No evidence of swelling, hemorrhage, hydrocephalus, extra-axial collection or mass lesion/mass effect. Small right inferior cerebellar infarct which is intermediate density. Normal brain volume.  Vascular: No hyperdense vessel or unexpected calcification.  Skull: Normal. Negative for fracture or focal lesion.  Sinuses/Orbits: No acute finding.  CT CERVICAL SPINE FINDINGS  Alignment: No traumatic malalignment  Skull base and vertebrae: No acute fracture. No primary bone lesion or focal pathologic process.  Soft tissues and spinal canal: No prevertebral fluid or swelling. No visible canal hematoma.  Disc levels:  Ordinary degenerative spurring which is mild for age  Upper chest: No evidence of injury  IMPRESSION: 1. No evidence of acute intracranial or cervical spine injury. 2. Small right cerebellar infarct which is age indeterminate based on intermediate density.  Electronically Signed   By: Tiburcio Pea M.D.   On: 06/25/2022 04:22  Lab Results  Component Value Date   WBC 8.9 06/25/2022   HGB 13.7 06/25/2022   HCT 43.0 06/25/2022   MCV 94.3 06/25/2022   PLT 154 06/25/2022   Last metabolic panel Lab Results  Component Value Date   GLUCOSE 258 (H) 06/25/2022   NA 140 06/25/2022   K 3.5 06/25/2022   CL 103 06/25/2022   CO2 27 06/25/2022   BUN 23 06/25/2022   CREATININE 0.94 06/25/2022   GFRNONAA 60 (L) 06/25/2022   CALCIUM 9.6 06/25/2022   PROT 7.8 06/25/2022   ALBUMIN 3.7 06/25/2022   BILITOT 0.7 06/25/2022   ALKPHOS 94 06/25/2022   AST 19 06/25/2022   ALT 10 06/25/2022   ANIONGAP 10  06/25/2022    Assessment and Plan: * Syncope Positive syncopal episode at home associated with large bowel movement Suspect vasovagal episode Check orthostatics CT head grossly stable apart from old cerebellar infarct Nonfocal neuroexam MRI pending Add on 2D echo Gentle IV fluids Follow  BP (high blood pressure) BP stable Titrate home regimen with syncopal evaluation  Diabetes mellitus (HCC) SSI A1c  AD (Alzheimer's disease) (HCC) Continue Aricept      Advance Care Planning:   Code Status: Full Code   Consults: None   Family Communication: No family at the bedside    Greater than 50% was spent in counseling and coordination of care with patient Total encounter time 80 minutes or more  Severity of Illness: The appropriate patient status for this patient is OBSERVATION.  Observation status is judged to be reasonable and necessary in order to provide the required intensity of service to ensure the patient's safety. The patient's presenting symptoms, physical exam findings, and initial radiographic and laboratory data in the context of their medical condition is felt to place them at decreased risk for further clinical deterioration. Furthermore, it is anticipated that the patient will be medically stable for discharge from the hospital within 2 midnights of admission.   Author: Floydene Flock, MD 06/25/2022 8:11 AM  For on call review [email or web address].

## 2022-06-25 NOTE — ED Notes (Signed)
Patient resting with eyes closed. NAD noted. Respirations even and unlabored. 

## 2022-06-25 NOTE — ED Provider Notes (Signed)
[facility] Provider Note    Event Date/Time   First MD Initiated Contact with Patient 06/25/22 [phone]     (approximate)   History   Fall and Diarrhea   HPI  Alexis Oconnell is a 87 y.o. female with history of hypertension, hyperlipidemia, and diabetes who presents from home after a fall.  Per EMS, the patient stated that she fell from standing height.  She denied hitting her head or losing consciousness.  She denies any acute pain.  However, EMS found her to be lying in a pool of feces.  The patient states she got up twice during the night to use the bathroom.  She was having diarrhea.  On the second time returning from the bathroom she started to feel lightheaded and fell.  She does believe that she briefly lost consciousness.  She denies any nausea or vomiting.  She has no abdominal pain.  She denies any blood in the stool.  She has had no fever or chills.  She reports a mild cough for about the last week.  I reviewed the past medical records.  The patient was most recently seen by family medicine at [facility] on 04/11/2022 with symptoms of a viral URI and for follow-up of her chronic medical conditions.   Physical Exam   Triage Vital Signs: ED Triage Vitals  Enc Vitals Group     BP 06/25/22 0337 (!) 178/77     Pulse Rate 06/25/22 0337 97     Resp 06/25/22 0337 18     Temp 06/25/22 0337 98.3 F (36.8 C)     Temp Source 06/25/22 0337 Oral     SpO2 06/25/22 0337 98 %     Weight 06/25/22 0338 154 lb 5.2 oz (70 kg)     Height --      Head Circumference --      Peak Flow --      Pain Score --      Pain Loc --      Pain Edu? --      Excl. in GC? --     Most recent vital signs: Vitals:   06/25/22 0337  BP: (!) 178/77  Pulse: 97  Resp: 18  Temp: 98.3 F (36.8 C)  SpO2: 98%     General: Alert and oriented, no distress.  CV:  Good peripheral perfusion.  Resp:  Normal effort.  Abd:  Soft and nontender.  No distention.  Other:  Moist mucous  membranes.  EOMI.  PERRLA.  No facial droop.  Motor intact in all extremities.  No ataxia.   ED Results / Procedures / Treatments   Labs (all labs ordered are listed, but only abnormal results are displayed) Labs Reviewed  COMPREHENSIVE METABOLIC PANEL - Abnormal; Notable for the following components:      Result Value   Glucose, Bld 258 (*)    GFR, Estimated 60 (*)    All other components within normal limits  URINALYSIS, ROUTINE W REFLEX MICROSCOPIC - Abnormal; Notable for the following components:   Color, Urine STRAW (*)    APPearance CLEAR (*)    Leukocytes,Ua MODERATE (*)    All other components within normal limits  CBC WITH DIFFERENTIAL/PLATELET  TROPONIN I (HIGH SENSITIVITY)  TROPONIN I (HIGH SENSITIVITY)     EKG  ED ECG REPORT I, Dionne Bucy, the attending physician, personally viewed and interpreted this ECG.  Date: 06/25/2022 EKG Time: 0424 Rate: 76 Rhythm: normal sinus rhythm QRS Axis: Left  axis Intervals: normal ST/T Wave abnormalities: normal Narrative Interpretation: no evidence of acute ischemia    RADIOLOGY  Chest x-ray: I independently viewed and interpreted the images; there is no focal consolidation or edema  CT head/cervical spine:  IMPRESSION:  1. No evidence of acute intracranial or cervical spine injury.  2. Small right cerebellar infarct which is age indeterminate based  on intermediate density.     PROCEDURES:  Critical Care performed: No  Procedures   MEDICATIONS ORDERED IN ED: Medications  0.9 %  sodium chloride infusion (has no administration in time range)     IMPRESSION / MDM / ASSESSMENT AND PLAN / ED COURSE  I reviewed the triage vital signs and the nursing notes.  87 year old female with PMH as noted above presents after syncopal episode occurring when she got up from the bathroom after having diarrhea.  On exam she is hypertensive with otherwise normal vital signs but alert and oriented and overall  well-appearing.  Neurologic exam is nonfocal.  Abdomen soft and nontender.  Differential diagnosis includes, but is not limited to, dehydration/hypovolemia, electrolyte abnormality, other metabolic disturbance, gastroenteritis, viral syndrome, other acute infection, less likely cardiac etiology.  We will obtain chest x-ray given the recent cough, CT head and cervical spine, lab workup, and reassess.  Patient's presentation is most consistent with acute presentation with potential threat to life or bodily function.  The patient is on the cardiac monitor to evaluate for evidence of arrhythmia and/or significant heart rate changes.  ----------------------------------------- 6:20 AM on 06/25/2022 -----------------------------------------  Lab workup is unremarkable.  Electrolytes are normal.  There is no leukocytosis or anemia.  Initial troponin is negative.  Chest x-ray shows no acute findings.  CT cervical spine is negative.  CT head shows a small cerebral infarct of indeterminate age.  The patient has no focal neurologic deficits or clinical evidence of acute stroke.  I have ordered an MRI for further evaluation.  Given the patient's age and comorbidities and the diarrhea, acute weakness, and syncope I discussed admission and the patient and family agree.  I consulted Dr. Para March from the hospitalist service; based on our discussion she agrees to evaluate the patient for admission.   FINAL CLINICAL IMPRESSION(S) / ED DIAGNOSES   Final diagnoses:  Diarrhea, unspecified type  Syncope, unspecified syncope type     Rx / DC Orders   ED Discharge Orders     None        Note:  This document was prepared using Dragon voice recognition software and may include unintentional dictation errors.    Dionne Bucy, MD 06/25/22 0630

## 2022-06-26 DIAGNOSIS — R55 Syncope and collapse: Secondary | ICD-10-CM | POA: Diagnosis not present

## 2022-06-26 DIAGNOSIS — I10 Essential (primary) hypertension: Secondary | ICD-10-CM | POA: Diagnosis not present

## 2022-06-26 DIAGNOSIS — E1169 Type 2 diabetes mellitus with other specified complication: Secondary | ICD-10-CM | POA: Diagnosis not present

## 2022-06-26 LAB — GLUCOSE, CAPILLARY
Glucose-Capillary: 166 mg/dL — ABNORMAL HIGH (ref 70–99)
Glucose-Capillary: 168 mg/dL — ABNORMAL HIGH (ref 70–99)

## 2022-06-26 LAB — CBC
HCT: 35 % — ABNORMAL LOW (ref 36.0–46.0)
Hemoglobin: 11.4 g/dL — ABNORMAL LOW (ref 12.0–15.0)
MCH: 29.8 pg (ref 26.0–34.0)
MCHC: 32.6 g/dL (ref 30.0–36.0)
MCV: 91.6 fL (ref 80.0–100.0)
Platelets: 139 10*3/uL — ABNORMAL LOW (ref 150–400)
RBC: 3.82 MIL/uL — ABNORMAL LOW (ref 3.87–5.11)
RDW: 13.3 % (ref 11.5–15.5)
WBC: 5 10*3/uL (ref 4.0–10.5)
nRBC: 0 % (ref 0.0–0.2)

## 2022-06-26 LAB — COMPREHENSIVE METABOLIC PANEL
ALT: 9 U/L (ref 0–44)
AST: 15 U/L (ref 15–41)
Albumin: 3 g/dL — ABNORMAL LOW (ref 3.5–5.0)
Alkaline Phosphatase: 66 U/L (ref 38–126)
Anion gap: 8 (ref 5–15)
BUN: 20 mg/dL (ref 8–23)
CO2: 22 mmol/L (ref 22–32)
Calcium: 8.4 mg/dL — ABNORMAL LOW (ref 8.9–10.3)
Chloride: 109 mmol/L (ref 98–111)
Creatinine, Ser: 0.85 mg/dL (ref 0.44–1.00)
GFR, Estimated: >60 mL/min (ref >60)
Glucose, Bld: 176 mg/dL — ABNORMAL HIGH (ref 70–99)
Potassium: 3.2 mmol/L — ABNORMAL LOW (ref 3.5–5.1)
Sodium: 139 mmol/L (ref 135–145)
Total Bilirubin: 0.7 mg/dL (ref 0.3–1.2)
Total Protein: 6.1 g/dL — ABNORMAL LOW (ref 6.5–8.1)

## 2022-06-26 MED ORDER — POTASSIUM CHLORIDE CRYS ER 20 MEQ PO TBCR
40.0000 meq | EXTENDED_RELEASE_TABLET | Freq: Once | ORAL | Status: AC
  Administered 2022-06-26: 40 meq via ORAL
  Filled 2022-06-26: qty 2

## 2022-06-26 NOTE — Plan of Care (Signed)
IV to be removed, Discharge instructions reviewed and patient discharged to home

## 2022-06-26 NOTE — Care Management CC44 (Signed)
Condition Code 44 Documentation Completed  Patient Details  Name: MABRIE SERRES MRN: [id number] Date of Birth: Dec 02, 1935   Condition Code 44 given:  Yes Patient signature on Condition Code 44 notice:  Yes Documentation of 2 MD's agreement:  Yes Code 44 added to claim:  Yes    Darleene Cleaver, LCSW 06/26/2022, 1:08 PM

## 2022-06-26 NOTE — Care Management Obs Status (Signed)
MEDICARE OBSERVATION STATUS NOTIFICATION   Patient Details  Name: GEMA ACOMB MRN: [id number] Date of Birth: 08-Oct-1935   Medicare Observation Status Notification Given:  Yes    Darleene Cleaver, LCSW 06/26/2022, 1:08 PM

## 2022-06-26 NOTE — Discharge Summary (Signed)
Physician Discharge Summary  Alexis Oconnell:[id number] DOB: 12/29/1935 DOA: 06/25/2022  PCP: [facility]  Admit date: 06/25/2022 Discharge date: 06/26/2022  Admitted From: home  Disposition:  home w/ home health   Recommendations for Outpatient Follow-up:  Follow up with PCP in 1-2 weeks  Home Health: yes Equipment/Devices:  Discharge Condition: stable  CODE STATUS: full  Diet recommendation: Heart Healthy / Carb Modified   Brief/Interim Summary: HPI was taken from Dr. Alvester Morin: Alexis Oconnell is a 87 y.o. female with medical history significant of type 2 diabetes, hypertension, hyperlipidemia, Alzheimer's disease presenting with syncope.  Patient reports having large diarrhea-like bowel movement overnight.  Patient reports having significant weakness and dizziness after standing and passing out.  Patient denies any prior episodes like this recently.  Some reported straining with bowel movement.  No chest pain or shortness of breath.  No hemiparesis or confusion.  Had worsening weakness after the event.  EMS subsequently called patient being noted to be found lying in her own feces.  No recent medication changes.  Noted evaluation back in March showed an [facility] system for URI. No reported cough or SOB. No abdominal pain. No black or bloody bowel movements.  Presented to the ER afebrile, hemodynamically stable.  White count 8.9, hemoglobin 13.7, platelets 154, creatinine 0.94, glucose 258.  Urinalysis grossly stable.  CT head and CT C-spine grossly stable.  Chest x-ray within normal.  As per Dr. Mayford Knife 06/26/22: Pt had a syncopal episode at home likely from a vasovagal episode after a large bowel movement. CT head & MRI brain showed no acute intracranial abnormalities. Echo showed EF > 75%, grade I diastolic dysfunction, no regional wall motion abnormalities  Discharge Diagnoses:  Principal Problem:   Syncope Active Problems:   AD (Alzheimer's disease) (HCC)   Diabetes mellitus  (HCC)   BP (high blood pressure)  Syncope: etiology unclear, possible vasovagal episode after a large bowel movement at home. Echo showed EF >75%, grade I diastolic dysfunction & no regional wall motion abnormalities. CT head shows no acute intracranial findings. 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 Orthostatics were positive on 5/28 but negative on 5/29.    HTN: continue on home dose of metoprolol, amlodipine    DM2: poorly controlled, HbA1c 10.0. Continue on SSI w/ accuchecks   Hypokalemia: potassium given   Alzheimer's disease: continue on home dose of aricept  Discharge Instructions  Discharge Instructions     Diet - low sodium heart healthy   Complete by: As directed    Diet Carb Modified   Complete by: As directed    Discharge instructions   Complete by: As directed    F/u w/ PCP in 1-2 weeks   Increase activity slowly   Complete by: As directed       Allergies as of 06/26/2022   No Known Allergies      Medication List     TAKE these medications    amLODipine 10 MG tablet Commonly known as: NORVASC Take 10 mg by mouth daily.   aspirin EC 81 MG tablet Take 81 mg by mouth every other day.   donepezil 5 MG tablet Commonly known as: ARICEPT Take 5 mg by mouth every morning.   gabapentin 100 MG capsule Commonly known as: NEURONTIN Take 100 mg by mouth 2 times daily.   Hemocyte Plus 106-1 MG Caps Take 1 capsule by mouth daily.   Lantus SoloStar 100 UNIT/ML Solostar Pen Generic  drug: insulin glargine Inject 10 Units into the skin at bedtime.   metFORMIN 500 MG tablet Commonly known as: GLUCOPHAGE Take 500 mg by mouth 2 times daily.   metoprolol succinate 25 MG 24 hr tablet Commonly known as: TOPROL-XL Take 25 mg by mouth daily.   simvastatin 20 MG tablet Commonly known as: ZOCOR Take 20 mg by mouth every night at bedtime.               Durable Medical Equipment  (From  admission, onward)           Start     Ordered   06/26/22 1110  For home use only DME Bedside commode  Once       Question:  Patient needs a bedside commode to treat with the following condition  Answer:  Generalized weakness   06/26/22 1109   06/26/22 1033  For home use only DME Walker rolling  Once       Question Answer Comment  Walker: With 5 Inch Wheels   Patient needs a walker to treat with the following condition Generalized weakness      06/26/22 1032            No Known Allergies  Consultations:    Procedures/Studies: ECHOCARDIOGRAM COMPLETE  Result Date: 06/25/2022    ECHOCARDIOGRAM REPORT   Patient Name:   Alexis Oconnell Date of Exam: 06/25/2022 Medical Rec #:  [id number]      Height:       65.0 in Accession #:    [id number]     Weight:       154.3 lb Date of Birth:  Oct 01, 1935       BSA:          1.772 m Patient Age:    86 years       BP:           147/70 mmHg Patient Gender: F              HR:           86 bpm. Exam Location:  [facility] Procedure: 2D Echo, Cardiac Doppler and Color Doppler Indications:     Syncope  History:         Patient has no prior history of Echocardiogram examinations.                  Signs/Symptoms:Syncope and Alzheimer's; Risk                  Factors:Hypertension and Diabetes.  Sonographer:     Mikki Harbor Referring Phys:  [phone] Alexis Oconnell Diagnosing Phys: Yvonne Kendall MD IMPRESSIONS  1. Suspect significant intracavitary gradient, with peak LVOT gradient exceeding 118 mmHg. Suboptimal Doppler envelops limit evaluation.. Left ventricular ejection fraction, by estimation, is >75%. The left ventricle has hyperdynamic function. The left ventricle has no regional wall motion abnormalities. There is moderate left ventricular hypertrophy. Left ventricular diastolic parameters are consistent with Grade I diastolic dysfunction (impaired relaxation).  2. Right ventricular systolic function is normal. The right ventricular size is normal. There is  normal pulmonary artery systolic pressure.  3. The mitral valve is degenerative. Mild mitral valve regurgitation. No evidence of mitral stenosis. Moderate mitral annular calcification.  4. The aortic valve is tricuspid. Aortic valve regurgitation is mild. There does not appear to be significant aortic valve stenosis, though aortic valve gradient is obscured by what appears to be a large intracavitary/LVOT gradient.  5. The inferior vena cava  is normal in size with greater than 50% respiratory variability, suggesting right atrial pressure of 3 mmHg. FINDINGS  Left Ventricle: Suspect significant intracavitary gradient, with peak LVOT gradient exceeding 118 mmHg. Suboptimal Doppler envelops limit evaluation. Left ventricular ejection fraction, by estimation, is >75%. The left ventricle has hyperdynamic function. The left ventricle has no regional wall motion abnormalities. The left ventricular internal cavity size was normal in size. There is moderate left ventricular hypertrophy. Left ventricular diastolic parameters are consistent with Grade I diastolic dysfunction (impaired relaxation). Right Ventricle: The right ventricular size is normal. No increase in right ventricular wall thickness. Right ventricular systolic function is normal. There is normal pulmonary artery systolic pressure. The tricuspid regurgitant velocity is 2.42 m/s, and  with an assumed right atrial pressure of 3 mmHg, the estimated right ventricular systolic pressure is 26.4 mmHg. Left Atrium: Left atrial size was normal in size. Right Atrium: Right atrial size was normal in size. Pericardium: There is no evidence of pericardial effusion. Mitral Valve: The mitral valve is degenerative in appearance. There is mild calcification of the mitral valve leaflet(s). Moderate mitral annular calcification. Mild mitral valve regurgitation. No evidence of mitral valve stenosis. MV peak gradient, 17.8  mmHg. The mean mitral valve gradient is 5.0 mmHg. Tricuspid  Valve: The tricuspid valve is not well visualized. Tricuspid valve regurgitation is trivial. Aortic Valve: The aortic valve is tricuspid. Aortic valve regurgitation is mild. There does not appear to be significant aortic valve stenosis, though aortic valve gradient is obscured by what appears to be a large intracavitary/LVOT gradient. Aortic valve mean gradient measures 9.7 mmHg. Aortic valve peak gradient measures 19.9 mmHg. Aortic valve area, by VTI measures 2.85 cm. Pulmonic Valve: The pulmonic valve was not well visualized. Pulmonic valve regurgitation is mild. No evidence of pulmonic stenosis. Aorta: The aortic root and ascending aorta are structurally normal, with no evidence of dilitation. Pulmonary Artery: The pulmonary artery is not well seen. Venous: The inferior vena cava is normal in size with greater than 50% respiratory variability, suggesting right atrial pressure of 3 mmHg. IAS/Shunts: The interatrial septum was not well visualized.  LEFT VENTRICLE PLAX 2D LVIDd:         3.20 cm   Diastology LVIDs:         2.10 cm   LV e' medial:    6.64 cm/s LV PW:         1.40 cm   LV E/e' medial:  14.1 LV IVS:        1.40 cm   LV e' lateral:   8.38 cm/s LVOT diam:     2.00 cm   LV E/e' lateral: 11.2 LV SV:         145 LV SV Index:   82 LVOT Area:     3.14 cm  RIGHT VENTRICLE RV Basal diam:  3.30 cm RV Mid diam:    2.60 cm RV S prime:     18.30 cm/s LEFT ATRIUM             Index        RIGHT ATRIUM           Index LA diam:        3.10 cm 1.75 cm/m   RA Area:     16.50 cm LA Vol (A2C):   57.6 ml 32.51 ml/m  RA Volume:   43.60 ml  24.61 ml/m LA Vol (A4C):   56.6 ml 31.95 ml/m LA Biplane Vol: 56.8 ml 32.06 ml/m  AORTIC VALVE                     PULMONIC VALVE AV Area (Vmax):    3.34 cm      PV Vmax:       1.24 m/s AV Area (Vmean):   3.23 cm      PV Peak grad:  6.2 mmHg AV Area (VTI):     2.85 cm AV Vmax:           223.00 cm/s AV Vmean:          140.000 cm/s AV VTI:            0.507 m AV Peak Grad:      19.9  mmHg AV Mean Grad:      9.7 mmHg LVOT Vmax:         237.00 cm/s LVOT Vmean:        144.000 cm/s LVOT VTI:          0.460 m LVOT/AV VTI ratio: 0.91  AORTA Ao Root diam: 3.30 cm Ao Asc diam:  3.40 cm MITRAL VALVE                TRICUSPID VALVE MV Area (PHT): 3.03 cm     TR Peak grad:   23.4 mmHg MV Area VTI:   3.67 cm     TR Vmax:        242.00 cm/s MV Peak grad:  17.8 mmHg MV Mean grad:  5.0 mmHg     SHUNTS MV Vmax:       2.11 m/s     Systemic VTI:  0.46 m MV Vmean:      97.6 cm/s    Systemic Diam: 2.00 cm MV Decel Time: 250 msec MV E velocity: 93.80 cm/s MV A velocity: 182.00 cm/s MV E/A ratio:  0.52 Cristal Deer End MD Electronically signed by Yvonne Kendall MD Signature Date/Time: 06/25/2022/5:19:15 PM    Final    MR BRAIN WO CONTRAST  Result Date: 06/25/2022 CLINICAL DATA:  Ground level fall. EXAM: MRI HEAD WITHOUT CONTRAST TECHNIQUE: Multiplanar, multiecho pulse sequences of the brain and surrounding structures were obtained without intravenous contrast. COMPARISON:  Head CT from earlier today FINDINGS: Brain: No acute infarction, hemorrhage, hydrocephalus, extra-axial collection or mass lesion. Small remote infarcts in the right cerebellum. Mild for age chronic small vessel ischemia in the cerebral white matter. Age normal brain volume. Vascular: Normal flow voids. Skull and upper cervical spine: Normal marrow signal. Sinuses/Orbits: Negative. IMPRESSION: 1. No acute finding.  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 2. Mild chronic small vessel ischemia in the supratentorial white matter. Electronically Signed   By: Tiburcio Pea M.D.   On: 06/25/2022 07:13   DG Chest Port 1 View  Result Date: 06/25/2022 CLINICAL DATA:  Ground level fall.  Cough. EXAM: PORTABLE CHEST 1 VIEW COMPARISON:  10/28/2015 FINDINGS: Normal heart size and mediastinal contours. No acute infiltrate or edema. No effusion or pneumothorax. No acute osseous findings. IMPRESSION: No evidence of active disease. Electronically  Signed   By: Tiburcio Pea M.D.   On: 06/25/2022 05:07   CT Head Wo Contrast  Result Date: 06/25/2022 CLINICAL DATA:  Neck trauma EXAM: CT HEAD WITHOUT CONTRAST CT CERVICAL SPINE WITHOUT CONTRAST TECHNIQUE: Multidetector CT imaging of the head and cervical spine was performed following the standard protocol without intravenous contrast. Multiplanar CT image reconstructions of the cervical spine were also generated. RADIATION DOSE REDUCTION: This exam was performed according to the  departmental dose-optimization program which includes automated exposure control, adjustment of the mA and/or kV according to patient size and/or use of iterative reconstruction technique. COMPARISON:  Head CT 10/28/2015 FINDINGS: CT HEAD FINDINGS Brain: No evidence of swelling, hemorrhage, hydrocephalus, extra-axial collection or mass lesion/mass effect. Small right inferior cerebellar infarct which is intermediate density. Normal brain volume. Vascular: No hyperdense vessel or unexpected calcification. Skull: Normal. Negative for fracture or focal lesion. Sinuses/Orbits: No acute finding. CT CERVICAL SPINE FINDINGS Alignment: No traumatic malalignment Skull base and vertebrae: No acute fracture. No primary bone lesion or focal pathologic process. Soft tissues and spinal canal: No prevertebral fluid or swelling. No visible canal hematoma. Disc levels:  Ordinary degenerative spurring which is mild for age Upper chest: No evidence of injury IMPRESSION: 1. No evidence of acute intracranial or cervical spine injury. 2. Small right cerebellar infarct which is age indeterminate based on intermediate density. Electronically Signed   By: Tiburcio Pea M.D.   On: 06/25/2022 04:22   CT Cervical Spine Wo Contrast  Result Date: 06/25/2022 CLINICAL DATA:  Neck trauma EXAM: CT HEAD WITHOUT CONTRAST CT CERVICAL SPINE WITHOUT CONTRAST TECHNIQUE: Multidetector CT imaging of the head and cervical spine was performed following the standard  protocol without intravenous contrast. Multiplanar CT image reconstructions of the cervical spine were also generated. RADIATION DOSE REDUCTION: This exam was performed according to the departmental dose-optimization program which includes automated exposure control, adjustment of the mA and/or kV according to patient size and/or use of iterative reconstruction technique. COMPARISON:  Head CT 10/28/2015 FINDINGS: CT HEAD FINDINGS Brain: No evidence of swelling, hemorrhage, hydrocephalus, extra-axial collection or mass lesion/mass effect. Small right inferior cerebellar infarct which is intermediate density. Normal brain volume. Vascular: No hyperdense vessel or unexpected calcification. Skull: Normal. Negative for fracture or focal lesion. Sinuses/Orbits: No acute finding. CT CERVICAL SPINE FINDINGS Alignment: No traumatic malalignment Skull base and vertebrae: No acute fracture. No primary bone lesion or focal pathologic process. Soft tissues and spinal canal: No prevertebral fluid or swelling. No visible canal hematoma. Disc levels:  Ordinary degenerative spurring which is mild for age Upper chest: No evidence of injury IMPRESSION: 1. No evidence of acute intracranial or cervical spine injury. 2. Small right cerebellar infarct which is age indeterminate based on intermediate density. Electronically Signed   By: Tiburcio Pea M.D.   On: 06/25/2022 04:22   (Echo, Carotid, EGD, Colonoscopy, ERCP)    Subjective: Pt denies any complaints    Discharge Exam: Vitals:   06/26/22 0536 06/26/22 0828  BP: (!) 159/75 (!) 158/76  Pulse: 80 71  Resp: 18 18  Temp: 98.4 F (36.9 C) 98.2 F (36.8 C)  SpO2: 95% 98%   Vitals:   06/25/22 1926 06/26/22 0535 06/26/22 0536 06/26/22 0828  BP: (!) 157/75  (!) 159/75 (!) 158/76  Pulse: 77  80 71  Resp: 18  18 18   Temp: 98.7 F (37.1 C)  98.4 F (36.9 C) 98.2 F (36.8 C)  TempSrc: Oral  Oral Oral  SpO2: 96%  95% 98%  Weight:  72.7 kg    Height:         General: Pt is alert, awake, not in acute distress Cardiovascular: S1/S2 +, no rubs, no gallops Respiratory: CTA bilaterally, no wheezing, no rhonchi Abdominal: Soft, NT, ND, bowel sounds + Extremities: no edema, no cyanosis    The results of significant diagnostics from this hospitalization (including imaging, microbiology, ancillary and laboratory) are listed below for reference.     Microbiology:  No results found for this or any previous visit (from the past 240 hour(s)).   Labs: BNP (last 3 results) No results for input(s): "BNP" in the last 8760 hours. Basic Metabolic Panel: Recent Labs  Lab 06/25/22 0401 06/26/22 0427  NA 140 139  K 3.5 3.2*  CL 103 109  CO2 27 22  GLUCOSE 258* 176*  BUN 23 20  CREATININE 0.94 0.85  CALCIUM 9.6 8.4*   Liver Function Tests: Recent Labs  Lab 06/25/22 0401 06/26/22 0427  AST 19 15  ALT 10 9  ALKPHOS 94 66  BILITOT 0.7 0.7  PROT 7.8 6.1*  ALBUMIN 3.7 3.0*   No results for input(s): "LIPASE", "AMYLASE" in the last 168 hours. No results for input(s): "AMMONIA" in the last 168 hours. CBC: Recent Labs  Lab 06/25/22 0401 06/26/22 0427  WBC 8.9 5.0  NEUTROABS 6.5  --   HGB 13.7 11.4*  HCT 43.0 35.0*  MCV 94.3 91.6  PLT 154 139*   Cardiac Enzymes: No results for input(s): "CKTOTAL", "CKMB", "CKMBINDEX", "TROPONINI" in the last 168 hours. BNP: Invalid input(s): "POCBNP" CBG: Recent Labs  Lab 06/25/22 1333 06/25/22 1706 06/25/22 2116 06/26/22 0829 06/26/22 1211  GLUCAP 285* 187* 125* 166* 168*   D-Dimer No results for input(s): "DDIMER" in the last 72 hours. Hgb A1c Recent Labs    06/25/22 0401  HGBA1C 10.0*   Lipid Profile No results for input(s): "CHOL", "HDL", "LDLCALC", "TRIG", "CHOLHDL", "LDLDIRECT" in the last 72 hours. Thyroid function studies No results for input(s): "TSH", "T4TOTAL", "T3FREE", "THYROIDAB" in the last 72 hours.  Invalid input(s): "FREET3" Anemia work up No results for  input(s): "VITAMINB12", "FOLATE", "FERRITIN", "TIBC", "IRON", "RETICCTPCT" in the last 72 hours. Urinalysis    Component Value Date/Time   COLORURINE STRAW (A) 06/25/2022 0401   APPEARANCEUR CLEAR (A) 06/25/2022 0401   LABSPEC 1.012 06/25/2022 0401   PHURINE 6.0 06/25/2022 0401   GLUCOSEU NEGATIVE 06/25/2022 0401   HGBUR NEGATIVE 06/25/2022 0401   BILIRUBINUR NEGATIVE 06/25/2022 0401   KETONESUR NEGATIVE 06/25/2022 0401   PROTEINUR NEGATIVE 06/25/2022 0401   NITRITE NEGATIVE 06/25/2022 0401   LEUKOCYTESUR MODERATE (A) 06/25/2022 0401   Sepsis Labs Recent Labs  Lab 06/25/22 0401 06/26/22 0427  WBC 8.9 5.0   Microbiology No results found for this or any previous visit (from the past 240 hour(s)).   Time coordinating discharge: Over 30 minutes  SIGNED:   Charise Killian, MD  [facility] 06/26/2022, 12:16 PM Pager   If 7PM-7AM, please contact night-coverage [email or web address]

## 2022-06-26 NOTE — Progress Notes (Signed)
Physical Therapy Treatment Patient Details Name: Alexis Oconnell MRN: [id number] DOB: Feb 18, 1935 Today's Date: 06/26/2022   History of Present Illness Pt is an 87 y.o. female with medical history significant of type 2 diabetes, hypertension, hyperlipidemia, Alzheimer's disease presenting with syncope.  Patient reports having large diarrhea-like bowel movement overnight.  Patient reports having significant weakness and dizziness after standing and passing out.  MD assessment includes: syncope with suspected vasovagal episode.  Per MRI impression: "No acute finding.  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 She is A and O x 3 and agreeable to session. Pt is extremely pleasant and agreeable to OOB activity. Elevated BP throughout session but pt endorses feeling normal. She was easily able to exit bed, stand to RW, and ambulate ~ 125 ft. No LOB or safety concern. Discussed case with pt's daughter and MD. Recommend continued skilled PT to maximize independence and safety with all ADLs.    Recommendations for follow up therapy are one component of a multi-disciplinary discharge planning process, led by the attending physician.  Recommendations may be updated based on patient status, additional functional criteria and insurance authorization.     Assistance Recommended at Discharge Intermittent Supervision/Assistance  Patient can return home with the following A little help with walking and/or transfers;A little help with bathing/dressing/bathroom;Assistance with cooking/housework;Assist for transportation;Help with stairs or ramp for entrance   Equipment Recommendations  Rolling walker (2 wheels);BSC/3in1       Precautions / Restrictions Precautions Precautions: Fall Restrictions Weight Bearing Restrictions: No     Mobility  Bed Mobility Overal bed mobility: Modified Independent Bed Mobility: Supine to Sit, Sit to Supine  Supine  to sit: Supervision Sit to supine: Supervision  Transfers Overall transfer level: Needs assistance Equipment used: Rolling walker (2 wheels) Transfers: Sit to/from Stand Sit to Stand: Supervision     Ambulation/Gait Ambulation/Gait assistance: Supervision Gait Distance (Feet): 125 Feet Assistive device: Rolling walker (2 wheels) Gait Pattern/deviations: Step-through pattern, Decreased step length - right, Decreased step length - left Gait velocity: decreased  General Gait Details: no LOB or safety concerns.   Balance Overall balance assessment: Modified Independent      Cognition Arousal/Alertness: Awake/alert Behavior During Therapy: WFL for tasks assessed/performed Overall Cognitive Status: Within Functional Limits for tasks assessed    General Comments: pt is A and O x 3. Easily and consistantly follows commands               Pertinent Vitals/Pain Pain Assessment Pain Assessment: No/denies pain     PT Goals (current goals can now be found in the care plan section) Acute Rehab PT Goals Patient Stated Goal: To return home Progress towards PT goals: Progressing toward goals    Frequency    Min 3X/week      PT Plan Current plan remains appropriate       AM-PAC PT "6 Clicks" Mobility   Outcome Measure  Help needed turning from your back to your side while in a flat bed without using bedrails?: None Help needed moving from lying on your back to sitting on the side of a flat bed without using bedrails?: None Help needed moving to and from a bed to a chair (including a wheelchair)?: A Little Help needed standing up from a chair using your arms (e.g., wheelchair or bedside chair)?: A Little Help needed to walk in hospital room?: A Little Help needed climbing 3-5 steps with a  railing? : A Little 6 Click Score: 20    End of Session   Activity Tolerance: Patient tolerated treatment well Patient left: in bed;with family/visitor present;with call bell/phone  within reach Nurse Communication: Mobility status;Other (comment) PT Visit Diagnosis: Muscle weakness (generalized) (M62.81);Difficulty in walking, not elsewhere classified (R26.2)     Time: 1610-9604 PT Time Calculation (min) (ACUTE ONLY): 20 min  Charges:  $Gait Training: 8-22 mins                    Jetta Lout PTA 06/26/22, 12:34 PM

## 2022-11-12 ENCOUNTER — Ambulatory Visit: Payer: 59 | Admitting: Podiatry

## 2023-03-03 ENCOUNTER — Encounter: Payer: Self-pay | Admitting: Podiatry

## 2023-03-03 ENCOUNTER — Ambulatory Visit: Payer: 59 | Admitting: Podiatry

## 2023-03-03 DIAGNOSIS — E0842 Diabetes mellitus due to underlying condition with diabetic polyneuropathy: Secondary | ICD-10-CM | POA: Diagnosis not present

## 2023-03-03 DIAGNOSIS — M79676 Pain in unspecified toe(s): Secondary | ICD-10-CM

## 2023-03-03 DIAGNOSIS — B351 Tinea unguium: Secondary | ICD-10-CM | POA: Diagnosis not present

## 2023-03-03 DIAGNOSIS — Q828 Other specified congenital malformations of skin: Secondary | ICD-10-CM | POA: Diagnosis not present

## 2023-03-03 DIAGNOSIS — E119 Type 2 diabetes mellitus without complications: Secondary | ICD-10-CM

## 2023-06-02 ENCOUNTER — Ambulatory Visit (INDEPENDENT_AMBULATORY_CARE_PROVIDER_SITE_OTHER): Payer: 59 | Admitting: Podiatry

## 2023-06-02 ENCOUNTER — Encounter: Payer: Self-pay | Admitting: Podiatry

## 2023-06-02 DIAGNOSIS — E0842 Diabetes mellitus due to underlying condition with diabetic polyneuropathy: Secondary | ICD-10-CM | POA: Diagnosis not present

## 2023-06-02 DIAGNOSIS — M79676 Pain in unspecified toe(s): Secondary | ICD-10-CM | POA: Diagnosis not present

## 2023-06-02 DIAGNOSIS — Q828 Other specified congenital malformations of skin: Secondary | ICD-10-CM | POA: Diagnosis not present

## 2023-06-02 DIAGNOSIS — B351 Tinea unguium: Secondary | ICD-10-CM | POA: Diagnosis not present

## 2023-06-07 NOTE — Progress Notes (Signed)
  Subjective:  Patient ID: Alexis Oconnell, female    DOB: [birth year],  MRN: [id number]  88 y.o. female presents at risk foot care with history of diabetic neuropathy and painful porokeratotic lesion(s) of both feet and painful mycotic toenails that limit ambulation. Painful toenails interfere with ambulation. Aggravating factors include wearing enclosed shoe gear. Pain is relieved with periodic professional debridement. Painful porokeratotic lesions are aggravated when weightbearing with and without shoegear. Pain is relieved with periodic professional debridement. Chief Complaint  Patient presents with   Diabetes    "Toenails clipped."  Alexis Oconnell [name] - 05/20/2023; A1c 12.6   New problem(s): None   PCP is [facility] Public house [facility].  No Known Allergies  Review of Systems: Negative except as noted in the HPI.   Objective:  Alexis Oconnell is a pleasant 88 y.o. female in NAD. AAO x 3.  Vascular Examination: Vascular status intact b/l with palpable pedal pulses. CFT immediate b/l. Pedal hair present. No edema. No pain with calf compression b/l. Skin temperature gradient WNL b/l. No varicosities noted. No cyanosis or clubbing noted.  Neurological Examination: Protective sensation diminished with 10g monofilament b/l.  Dermatological Examination: Pedal skin with normal turgor, texture and tone b/l. No open wounds nor interdigital macerations noted. Toenails 1-5 b/l thick, discolored, elongated with subungual debris and pain on dorsal palpation.   Porokeratotic lesion(s) submet head 2 b/l and submet head 3 b/l. No erythema, no edema, no drainage, no fluctuance.  Musculoskeletal Examination: Muscle strength 5/5 to b/l LE.  No pain, crepitus noted b/l. No gross pedal deformities. Patient ambulates independently without assistive aids.   Radiographs: None  Last A1c:      Latest Ref Rng & Units 06/25/2022    4:01 AM  Hemoglobin A1C  Hemoglobin-A1c 4.8 - 5.6 % 10.0    Assessment:   1.  Pain due to onychomycosis of toenail   2. Porokeratosis   3. Diabetes mellitus due to underlying condition with diabetic polyneuropathy, unspecified whether long term insulin  use (HCC)    Plan:  Consent given for treatment. Patient examined. All patient's and/or POA's questions/concerns addressed on today's visit. Mycotic toenails 1-5 debrided in length and girth without incident. Porokeratotic lesion(s) submet head 2 b/l and submet head 3 b/l pared and enucleated with sharp debridement without incident.Continue daily foot inspections and monitor blood glucose per PCP/Endocrinologist's recommendations. Continue soft, supportive shoe gear daily. Report any pedal injuries to medical professional. Call office if there are any quesitons/concerns. Return in about 3 months (around 09/02/2023).  Luella Sager, DPM      [place] LOCATION: [address], [place] [address]                   Office [phone]   [facility] LOCATION: [address] [place], [place] [address] Office [phone]

## 2023-07-28 DIAGNOSIS — Z9181 History of falling: Secondary | ICD-10-CM | POA: Insufficient documentation

## 2023-09-08 ENCOUNTER — Ambulatory Visit (INDEPENDENT_AMBULATORY_CARE_PROVIDER_SITE_OTHER): Admitting: Podiatry

## 2023-09-08 ENCOUNTER — Encounter: Payer: Self-pay | Admitting: Podiatry

## 2023-09-08 DIAGNOSIS — E0842 Diabetes mellitus due to underlying condition with diabetic polyneuropathy: Secondary | ICD-10-CM

## 2023-09-08 DIAGNOSIS — B351 Tinea unguium: Secondary | ICD-10-CM | POA: Diagnosis not present

## 2023-09-08 DIAGNOSIS — M79676 Pain in unspecified toe(s): Secondary | ICD-10-CM

## 2023-09-14 ENCOUNTER — Encounter: Payer: Self-pay | Admitting: Podiatry

## 2023-09-14 NOTE — Progress Notes (Signed)
  Subjective:  Patient ID: Alexis Oconnell, female    DOB: 03-21-1935,  MRN: [id number]  87 y.o. female presents at risk foot care with history of diabetic neuropathy and painful porokeratotic lesion(s) right foot and painful mycotic toenails that limit ambulation. Painful toenails interfere with ambulation. Aggravating factors include wearing enclosed shoe gear. Pain is relieved with periodic professional debridement. Painful porokeratotic lesions are aggravated when weightbearing with and without shoegear. Pain is relieved with periodic professional debridement.  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  No Known Allergies  Review of Systems: Negative except as noted in the HPI.   Objective:  Alexis Oconnell is a pleasant 88 y.o. female WD, WN in NAD. AAO x 3.  Vascular Examination: Vascular status intact b/l with palpable pedal pulses. CFT immediate b/l. Pedal hair present. No edema. No pain with calf compression b/l. Skin temperature gradient WNL b/l. No varicosities noted. No cyanosis or clubbing noted.  Neurological Examination: Protective sensation diminished with 10g monofilament b/l.  Dermatological Examination: Pedal skin with normal turgor, texture and tone b/l. No open wounds nor interdigital macerations noted. Toenails 1-5 b/l thick, discolored, elongated with subungual debris and pain on dorsal palpation.   Porokeratotic lesion(s) distal tipleft 4th toe.  No erythema, no edema, no drainage, no fluctuance.  Musculoskeletal Examination: Muscle strength 5/5 to b/l LE.  No pain, crepitus noted b/l. 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  Radiographs: None  Assessment:   1. Pain due to onychomycosis of toenail   2. Diabetes mellitus due to underlying condition with diabetic polyneuropathy, unspecified whether long term insulin  use (HCC)    Plan:  Patient was evaluated and treated. All patient's and/or POA's  questions/concerns addressed on today's visit. Toenails 1-5 debrided in length and girth without incident. Porokeratotic lesion(s) distal tip of right 4th toe pared with sharp debridement without incident. Continue daily foot inspections and monitor blood glucose per PCP/Endocrinologist's recommendations. Continue soft, supportive shoe gear daily. Report any pedal injuries to medical professional. Call office if there are any questions/concerns. -Patient/POA to call should there be question/concern in the interim.  Return in about 3 months (around 12/09/2023).  [name], DPM      [place] LOCATION: [address] [place] [address]                   Office [phone]   [facility] LOCATION: [address] [place], [place] [address] Office [phone]

## 2023-12-15 ENCOUNTER — Ambulatory Visit: Admitting: Podiatry

## 2023-12-15 DIAGNOSIS — B351 Tinea unguium: Secondary | ICD-10-CM

## 2023-12-15 DIAGNOSIS — E0842 Diabetes mellitus due to underlying condition with diabetic polyneuropathy: Secondary | ICD-10-CM

## 2023-12-15 DIAGNOSIS — M79676 Pain in unspecified toe(s): Secondary | ICD-10-CM | POA: Diagnosis not present

## 2023-12-21 ENCOUNTER — Encounter: Payer: Self-pay | Admitting: Podiatry

## 2023-12-21 NOTE — Progress Notes (Signed)
  Subjective:  Patient ID: Alexis Oconnell, female    DOB: Apr 23, 1935,  MRN: [id number]  Alexis Oconnell presents to clinic today for at risk foot care with history of diabetic neuropathy and painful thick toenails that are difficult to trim. Pain interferes with ambulation. Aggravating factors include wearing enclosed shoe gear. Pain is relieved with periodic professional debridement.  Chief Complaint  Patient presents with   Diabetes    DFC. [facility] is her PCP. A1c 10.   New problem(s): None.   PCP is [facility] Public House [facility].  No Known Allergies  Review of Systems: Negative except as noted in the HPI.  Objective: No changes noted in today's physical examination. There were no vitals filed for this visit. Alexis Oconnell is a pleasant 88 y.o. female WD, WN in NAD. AAO x 3.  Vascular Examination: Vascular status intact b/l with palpable pedal pulses. CFT immediate b/l. Pedal hair present. No edema. No pain with calf compression b/l. Skin temperature gradient WNL b/l. No varicosities noted. No cyanosis or clubbing noted.  Neurological Examination: Protective sensation diminished with 10g monofilament b/l.  Dermatological Examination: Pedal skin with normal turgor, texture and tone b/l. No open wounds nor interdigital macerations noted. Toenails 1-5 b/l thick, discolored, elongated with subungual debris and pain on dorsal palpation.   Porokeratotic lesion(s) distal tipleft 4th toe.  No erythema, no edema, no drainage, no fluctuance.  Musculoskeletal Examination: Muscle strength 5/5 to b/l LE.  No pain, crepitus noted b/l. 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  Radiographs: None  Assessment/Plan: 1. Pain due to onychomycosis of toenail   2. Diabetes mellitus due to underlying condition with diabetic polyneuropathy, unspecified whether long term insulin  use [facility])   Consent given for treatment. Patient examined. All patient's and/or POA's questions/concerns addressed on today's visit.  Mycotic toenails 1-5 b/l debrided in length and girth without incident. Continue foot and shoe inspections daily. Monitor blood glucose per PCP/Endocrinologist's recommendations.Continue soft, supportive shoe gear daily. Report any pedal injuries to medical professional. Call office if there are any quesitons/concerns. -Patient/POA to call should there be question/concern in the interim.   Return in about 3 months (around 03/16/2024).  [name], DPM      [place] LOCATION: [address] [place] [address]                   Office [phone]   [facility] LOCATION: [address] [place], [place] [address] Office [phone]

## 2024-03-25 ENCOUNTER — Ambulatory Visit: Admitting: Podiatry
